# Patient Record
Sex: Male | Born: 1955 | Race: White | Hispanic: No | Smoking: Former smoker
Health system: Southern US, Community
[De-identification: ages and names within clinical notes are randomized; demographics above are authoritative.]

## PROBLEM LIST (undated history)

## (undated) DIAGNOSIS — N200 Calculus of kidney: Secondary | ICD-10-CM

## (undated) DIAGNOSIS — E785 Hyperlipidemia, unspecified: Secondary | ICD-10-CM

## (undated) DIAGNOSIS — I10 Essential (primary) hypertension: Secondary | ICD-10-CM

## (undated) DIAGNOSIS — I2510 Atherosclerotic heart disease of native coronary artery without angina pectoris: Secondary | ICD-10-CM

## (undated) HISTORY — DX: Essential (primary) hypertension: I10

## (undated) HISTORY — PX: APPENDECTOMY: SHX54

## (undated) HISTORY — DX: Atherosclerotic heart disease of native coronary artery without angina pectoris: I25.10

## (undated) HISTORY — DX: Calculus of kidney: N20.0

## (undated) HISTORY — DX: Hyperlipidemia, unspecified: E78.5

---

## 2005-07-11 ENCOUNTER — Ambulatory Visit: Payer: Self-pay | Admitting: Internal Medicine

## 2005-07-11 ENCOUNTER — Encounter: Admission: RE | Admit: 2005-07-11 | Discharge: 2005-07-11 | Payer: Self-pay | Admitting: Internal Medicine

## 2006-01-07 ENCOUNTER — Ambulatory Visit: Payer: Self-pay | Admitting: Internal Medicine

## 2011-12-04 DIAGNOSIS — Z0000 Encounter for general adult medical examination without abnormal findings: Secondary | ICD-10-CM | POA: Insufficient documentation

## 2011-12-04 HISTORY — DX: Encounter for general adult medical examination without abnormal findings: Z00.00

## 2015-08-17 ENCOUNTER — Encounter (HOSPITAL_COMMUNITY): Payer: Self-pay | Admitting: Emergency Medicine

## 2015-08-17 ENCOUNTER — Emergency Department (HOSPITAL_COMMUNITY): Payer: No Typology Code available for payment source

## 2015-08-17 ENCOUNTER — Emergency Department (HOSPITAL_COMMUNITY)
Admission: EM | Admit: 2015-08-17 | Discharge: 2015-08-17 | Disposition: A | Discharge status: Home / Self Care | Payer: No Typology Code available for payment source | Attending: Emergency Medicine | Admitting: Emergency Medicine

## 2015-08-17 DIAGNOSIS — N23 Unspecified renal colic: Secondary | ICD-10-CM

## 2015-08-17 DIAGNOSIS — R109 Unspecified abdominal pain: Secondary | ICD-10-CM | POA: Diagnosis present

## 2015-08-17 LAB — URINALYSIS, ROUTINE W REFLEX MICROSCOPIC
Bilirubin Urine: NEGATIVE
Glucose, UA: NEGATIVE mg/dL
Ketones, ur: NEGATIVE mg/dL
Leukocytes, UA: NEGATIVE
Nitrite: NEGATIVE
Protein, ur: NEGATIVE mg/dL
Specific Gravity, Urine: 1.025 (ref 1.005–1.030)
pH: 6 (ref 5.0–8.0)

## 2015-08-17 LAB — BASIC METABOLIC PANEL
Anion gap: 11 (ref 5–15)
BUN: 18 mg/dL (ref 6–20)
CO2: 23 mmol/L (ref 22–32)
Calcium: 9.2 mg/dL (ref 8.9–10.3)
Chloride: 107 mmol/L (ref 101–111)
Creatinine, Ser: 1.27 mg/dL — ABNORMAL HIGH (ref 0.61–1.24)
GFR calc Af Amer: >60 mL/min (ref >60)
GFR calc non Af Amer: 60 mL/min — ABNORMAL LOW (ref >60)
Glucose, Bld: 167 mg/dL — ABNORMAL HIGH (ref 65–99)
Potassium: 4.6 mmol/L (ref 3.5–5.1)
Sodium: 141 mmol/L (ref 135–145)

## 2015-08-17 LAB — CBC
HCT: 47.8 % (ref 39.0–52.0)
Hemoglobin: 16.7 g/dL (ref 13.0–17.0)
MCH: 31.9 pg (ref 26.0–34.0)
MCHC: 34.9 g/dL (ref 30.0–36.0)
MCV: 91.4 fL (ref 78.0–100.0)
Platelets: 190 10*3/uL (ref 150–400)
RBC: 5.23 MIL/uL (ref 4.22–5.81)
RDW: 12.9 % (ref 11.5–15.5)
WBC: 15.9 10*3/uL — ABNORMAL HIGH (ref 4.0–10.5)

## 2015-08-17 LAB — URINE MICROSCOPIC-ADD ON: Squamous Epithelial / LPF: NONE SEEN

## 2015-08-17 MED ORDER — OXYCODONE-ACETAMINOPHEN 5-325 MG PO TABS: 1.0000 | ORAL_TABLET | ORAL | Status: DC | PRN

## 2015-08-17 MED ORDER — KETOROLAC TROMETHAMINE 15 MG/ML IJ SOLN
15.0000 mg | Freq: Once | INTRAMUSCULAR | Status: AC
  Administered 2015-08-17: 15 mg via INTRAVENOUS
  Filled 2015-08-17: qty 1

## 2015-08-17 MED ORDER — SODIUM CHLORIDE 0.9 % IV BOLUS (SEPSIS)
1000.0000 mL | Freq: Once | INTRAVENOUS | Status: AC
  Administered 2015-08-17: 1000 mL via INTRAVENOUS

## 2015-08-17 MED ORDER — HYDROMORPHONE HCL 1 MG/ML IJ SOLN
1.0000 mg | Freq: Once | INTRAMUSCULAR | Status: AC
  Administered 2015-08-17: 1 mg via INTRAVENOUS
  Filled 2015-08-17: qty 1

## 2015-08-17 NOTE — ED Notes (Signed)
Pt c/o sudden onset R flank pain since 3:30pm this afternoon. Pt denies hx of kidney stone. Pt was seen at urgent care earlier today and saw blood in his urine. Urgent care sent him here for CT scan. Pt denies burning upon urination but c/o urinary retention. Pt was able to go when he got here and has specimen available at this time. Pt unable to sit comfortably due to pain. A&Ox4.

## 2015-08-17 NOTE — Discharge Instructions (Signed)
Kidney Stones °Kidney stones (urolithiasis) are deposits that form inside your kidneys. The intense pain is caused by the stone moving through the urinary tract. When the stone moves, the ureter goes into spasm around the stone. The stone is usually passed in the urine.  °CAUSES  °· A disorder that makes certain neck glands produce too much parathyroid hormone (primary hyperparathyroidism). °· A buildup of uric acid crystals, similar to gout in your joints. °· Narrowing (stricture) of the ureter. °· A kidney obstruction present at birth (congenital obstruction). °· Previous surgery on the kidney or ureters. °· Numerous kidney infections. °SYMPTOMS  °· Feeling sick to your stomach (nauseous). °· Throwing up (vomiting). °· Blood in the urine (hematuria). °· Pain that usually spreads (radiates) to the groin. °· Frequency or urgency of urination. °DIAGNOSIS  °· Taking a history and physical exam. °· Blood or urine tests. °· CT scan. °· Occasionally, an examination of the inside of the urinary bladder (cystoscopy) is performed. °TREATMENT  °· Observation. °· Increasing your fluid intake. °· Extracorporeal shock wave lithotripsy--This is a noninvasive procedure that uses shock waves to break up kidney stones. °· Surgery may be needed if you have severe pain or persistent obstruction. There are various surgical procedures. Most of the procedures are performed with the use of small instruments. Only small incisions are needed to accommodate these instruments, so recovery time is minimized. °The size, location, and chemical composition are all important variables that will determine the proper choice of action for you. Talk to your health care provider to better understand your situation so that you will minimize the risk of injury to yourself and your kidney.  °HOME CARE INSTRUCTIONS  °· Drink enough water and fluids to keep your urine clear or pale yellow. This will help you to pass the stone or stone fragments. °· Strain  all urine through the provided strainer. Keep all particulate matter and stones for your health care provider to see. The stone causing the pain may be as small as a grain of salt. It is very important to use the strainer each and every time you pass your urine. The collection of your stone will allow your health care provider to analyze it and verify that a stone has actually passed. The stone analysis will often identify what you can do to reduce the incidence of recurrences. °· Only take over-the-counter or prescription medicines for pain, discomfort, or fever as directed by your health care provider. °· Keep all follow-up visits as told by your health care provider. This is important. °· Get follow-up X-rays if required. The absence of pain does not always mean that the stone has passed. It may have only stopped moving. If the urine remains completely obstructed, it can cause loss of kidney function or even complete destruction of the kidney. It is your responsibility to make sure X-rays and follow-ups are completed. Ultrasounds of the kidney can show blockages and the status of the kidney. Ultrasounds are not associated with any radiation and can be performed easily in a matter of minutes. °· Make changes to your daily diet as told by your health care provider. You may be told to: °¨ Limit the amount of salt that you eat. °¨ Eat 5 or more servings of fruits and vegetables each day. °¨ Limit the amount of meat, poultry, fish, and eggs that you eat. °· Collect a 24-hour urine sample as told by your health care provider. You may need to collect another urine sample every 6-12   months. °SEEK MEDICAL CARE IF: °· You experience pain that is progressive and unresponsive to any pain medicine you have been prescribed. °SEEK IMMEDIATE MEDICAL CARE IF:  °· Pain cannot be controlled with the prescribed medicine. °· You have a fever or shaking chills. °· The severity or intensity of pain increases over 18 hours and is not  relieved by pain medicine. °· You develop a new onset of abdominal pain. °· You feel faint or pass out. °· You are unable to urinate. °  °This information is not intended to replace advice given to you by your health care provider. Make sure you discuss any questions you have with your health care provider. °  °Document Released: 07/07/2005 Document Revised: 03/28/2015 Document Reviewed: 12/08/2012 °Elsevier Interactive Patient Education ©2016 [employer] ° °

## 2015-08-22 NOTE — ED Provider Notes (Signed)
CSN: [id number]     Arrival date & time 08/17/15  1933 History   First MD Initiated Contact with Patient 08/17/15 2015     Chief Complaint  Patient presents with  . Flank Pain     (Consider location/radiation/quality/duration/timing/severity/associated sxs/prior Treatment) HPI   60 year old male with right flank pain. Acute onset around 3:30 this afternoon.Pain feels like a deep ache with occasional sharper pain. He cannot find a comfortable position. He went to urgent care prior to arrival and reports that they told him he had blood in his urine. Denies any past history of kidney stones are similar symptoms. No specific urinary complaints. Nausea and did vomit once earlier today. No fevers or chills.  History reviewed. No pertinent past medical history. Past Surgical History  Procedure Laterality Date  . Appendectomy     No family history on file. Social History  Substance Use Topics  . Smoking status: Never Smoker   . Smokeless tobacco: None  . Alcohol Use: Yes    Review of Systems  All systems reviewed and negative, other than as noted in HPI.   Allergies  Review of patient's allergies indicates no known allergies.  Home Medications   Prior to Admission medications   Medication Sig Start Date End Date Taking? Authorizing Provider  loratadine-pseudoephedrine (CLARITIN-D 24-HOUR) 10-240 MG 24 hr tablet Take 1 tablet by mouth daily as needed for allergies.   Yes Historical Provider, MD  oxyCODONE-acetaminophen (PERCOCET/ROXICET) 5-325 MG tablet Take 1-2 tablets by mouth every 4 (four) hours as needed for severe pain. 08/17/15   Raeford Razor, MD   BP 155/84 mmHg  Pulse 76  Temp(Src) 98.2 F (36.8 C) (Oral)  Resp 16  SpO2 97% Physical Exam  Constitutional: He appears well-developed and well-nourished. No distress.  laying in bed. Appears uncomfortable, but not toxic.  HENT:  Head: Normocephalic and atraumatic.  Eyes: Conjunctivae are normal. Right eye exhibits no  discharge. Left eye exhibits no discharge.  Neck: Neck supple.  Cardiovascular: Normal rate, regular rhythm and normal heart sounds.  Exam reveals no gallop and no friction rub.   No murmur heard. Pulmonary/Chest: Effort normal and breath sounds normal. No respiratory distress.  Abdominal: Soft. He exhibits no distension. There is no tenderness.  Musculoskeletal: He exhibits no edema or tenderness.  Neurological: He is alert.  Skin: Skin is warm and dry.  Psychiatric: He has a normal mood and affect. His behavior is normal. Thought content normal.  Nursing note and vitals reviewed.   ED Course  Procedures (including critical care time) Labs Review Labs Reviewed  CBC - Abnormal; Notable for the following:    WBC 15.9 (*)    All other components within normal limits  URINALYSIS, ROUTINE W REFLEX MICROSCOPIC (NOT AT [facility]) - Abnormal; Notable for the following:    APPearance CLOUDY (*)    Hgb urine dipstick TRACE (*)    All other components within normal limits  BASIC METABOLIC PANEL - Abnormal; Notable for the following:    Glucose, Bld 167 (*)    Creatinine, Ser 1.27 (*)    GFR calc non Af Amer 60 (*)    All other components within normal limits  URINE MICROSCOPIC-ADD ON - Abnormal; Notable for the following:    Bacteria, UA RARE (*)    Crystals CA OXALATE CRYSTALS (*)    All other components within normal limits    Imaging Review No results found. I have personally reviewed and evaluated these images and lab results as part  of my medical decision-making.   EKG Interpretation None      MDM   Final diagnoses:  Ureteral colic    Right flank pain. Distal right ureteral stone.Symptoms controlled. Afebrile. renal function is fine. UA is not consistent with infection. I feel appropriate for discharge at this time.  Expectant management. Urology follow-up as needed. Return precautions discussed.    Raeford Razor, MD 08/22/15 1218

## 2019-06-21 ENCOUNTER — Other Ambulatory Visit: Payer: Self-pay

## 2019-06-21 ENCOUNTER — Emergency Department (HOSPITAL_COMMUNITY): Payer: BC Managed Care – PPO

## 2019-06-21 ENCOUNTER — Inpatient Hospital Stay (HOSPITAL_COMMUNITY)
Admission: EM | Admit: 2019-06-21 | Discharge: 2019-06-28 | DRG: 177 | Disposition: A | Discharge status: Home / Self Care | Payer: BC Managed Care – PPO | Attending: Family Medicine | Admitting: Family Medicine

## 2019-06-21 ENCOUNTER — Encounter (HOSPITAL_COMMUNITY): Payer: Self-pay

## 2019-06-21 DIAGNOSIS — R0602 Shortness of breath: Secondary | ICD-10-CM

## 2019-06-21 DIAGNOSIS — J1289 Other viral pneumonia: Secondary | ICD-10-CM

## 2019-06-21 DIAGNOSIS — Z79899 Other long term (current) drug therapy: Secondary | ICD-10-CM

## 2019-06-21 DIAGNOSIS — J159 Unspecified bacterial pneumonia: Secondary | ICD-10-CM | POA: Diagnosis present

## 2019-06-21 DIAGNOSIS — E871 Hypo-osmolality and hyponatremia: Secondary | ICD-10-CM | POA: Diagnosis present

## 2019-06-21 DIAGNOSIS — U071 COVID-19: Secondary | ICD-10-CM | POA: Diagnosis present

## 2019-06-21 DIAGNOSIS — R0902 Hypoxemia: Secondary | ICD-10-CM

## 2019-06-21 DIAGNOSIS — R739 Hyperglycemia, unspecified: Secondary | ICD-10-CM | POA: Diagnosis present

## 2019-06-21 DIAGNOSIS — J9601 Acute respiratory failure with hypoxia: Secondary | ICD-10-CM | POA: Diagnosis present

## 2019-06-21 DIAGNOSIS — J1282 Pneumonia due to coronavirus disease 2019: Secondary | ICD-10-CM | POA: Diagnosis present

## 2019-06-21 LAB — TRIGLYCERIDES: Triglycerides: 145 mg/dL (ref <150)

## 2019-06-21 LAB — CBC WITH DIFFERENTIAL/PLATELET
Abs Immature Granulocytes: 0.19 10*3/uL — ABNORMAL HIGH (ref 0.00–0.07)
Basophils Absolute: 0.1 10*3/uL (ref 0.0–0.1)
Basophils Relative: 1 %
Eosinophils Absolute: 0 10*3/uL (ref 0.0–0.5)
Eosinophils Relative: 0 %
HCT: 48.4 % (ref 39.0–52.0)
Hemoglobin: 16.2 g/dL (ref 13.0–17.0)
Immature Granulocytes: 1 %
Lymphocytes Relative: 9 %
Lymphs Abs: 1.2 10*3/uL (ref 0.7–4.0)
MCH: 30.4 pg (ref 26.0–34.0)
MCHC: 33.5 g/dL (ref 30.0–36.0)
MCV: 90.8 fL (ref 80.0–100.0)
Monocytes Absolute: 0.9 10*3/uL (ref 0.1–1.0)
Monocytes Relative: 6 %
Neutro Abs: 11.1 10*3/uL — ABNORMAL HIGH (ref 1.7–7.7)
Neutrophils Relative %: 83 %
Platelets: 277 10*3/uL (ref 150–400)
RBC: 5.33 MIL/uL (ref 4.22–5.81)
RDW: 12.5 % (ref 11.5–15.5)
WBC: 13.5 10*3/uL — ABNORMAL HIGH (ref 4.0–10.5)
nRBC: 0 % (ref 0.0–0.2)

## 2019-06-21 LAB — COMPREHENSIVE METABOLIC PANEL
ALT: 55 U/L — ABNORMAL HIGH (ref 0–44)
AST: 48 U/L — ABNORMAL HIGH (ref 15–41)
Albumin: 3.1 g/dL — ABNORMAL LOW (ref 3.5–5.0)
Alkaline Phosphatase: 59 U/L (ref 38–126)
Anion gap: 13 (ref 5–15)
BUN: 17 mg/dL (ref 8–23)
CO2: 24 mmol/L (ref 22–32)
Calcium: 9.2 mg/dL (ref 8.9–10.3)
Chloride: 97 mmol/L — ABNORMAL LOW (ref 98–111)
Creatinine, Ser: 0.92 mg/dL (ref 0.61–1.24)
GFR calc Af Amer: >60 mL/min (ref >60)
GFR calc non Af Amer: >60 mL/min (ref >60)
Glucose, Bld: 131 mg/dL — ABNORMAL HIGH (ref 70–99)
Potassium: 3.6 mmol/L (ref 3.5–5.1)
Sodium: 134 mmol/L — ABNORMAL LOW (ref 135–145)
Total Bilirubin: 1 mg/dL (ref 0.3–1.2)
Total Protein: 7.4 g/dL (ref 6.5–8.1)

## 2019-06-21 LAB — D-DIMER, QUANTITATIVE: D-Dimer, Quant: 2.38 ug/mL-FEU — ABNORMAL HIGH (ref 0.00–0.50)

## 2019-06-21 LAB — PROCALCITONIN: Procalcitonin: 0.32 ng/mL

## 2019-06-21 LAB — FERRITIN: Ferritin: 1867 ng/mL — ABNORMAL HIGH (ref 24–336)

## 2019-06-21 LAB — LACTIC ACID, PLASMA: Lactic Acid, Venous: 1.8 mmol/L (ref 0.5–1.9)

## 2019-06-21 LAB — LACTATE DEHYDROGENASE: LDH: 417 U/L — ABNORMAL HIGH (ref 98–192)

## 2019-06-21 LAB — C-REACTIVE PROTEIN: CRP: 17.6 mg/dL — ABNORMAL HIGH (ref <1.0)

## 2019-06-21 LAB — FIBRINOGEN: Fibrinogen: >800 mg/dL — ABNORMAL HIGH (ref 210–475)

## 2019-06-21 MED ORDER — SODIUM CHLORIDE 0.9 % IV SOLN
500.0000 mg | INTRAVENOUS | Status: AC
  Administered 2019-06-21 – 2019-06-25 (×5): 500 mg via INTRAVENOUS
  Filled 2019-06-21 (×5): qty 500

## 2019-06-21 MED ORDER — ONDANSETRON HCL 4 MG/2ML IJ SOLN: 4.0000 mg | Freq: Four times a day (QID) | INTRAMUSCULAR | Status: DC | PRN

## 2019-06-21 MED ORDER — SODIUM CHLORIDE 0.9 % IV SOLN
2.0000 g | INTRAVENOUS | Status: AC
  Administered 2019-06-21 – 2019-06-25 (×5): 2 g via INTRAVENOUS
  Filled 2019-06-21 (×6): qty 20

## 2019-06-21 MED ORDER — SODIUM CHLORIDE 0.9 % IV SOLN
100.0000 mg | INTRAVENOUS | Status: DC
  Administered 2019-06-22 – 2019-06-24 (×3): 100 mg via INTRAVENOUS
  Filled 2019-06-21 (×3): qty 100

## 2019-06-21 MED ORDER — DEXAMETHASONE SODIUM PHOSPHATE 10 MG/ML IJ SOLN: 6.0000 mg | INTRAMUSCULAR | Status: DC

## 2019-06-21 MED ORDER — ONDANSETRON HCL 4 MG PO TABS: 4.0000 mg | ORAL_TABLET | Freq: Four times a day (QID) | ORAL | Status: DC | PRN

## 2019-06-21 MED ORDER — FAMOTIDINE 20 MG PO TABS
20.0000 mg | ORAL_TABLET | Freq: Two times a day (BID) | ORAL | Status: DC
  Administered 2019-06-21 – 2019-06-28 (×14): 20 mg via ORAL
  Filled 2019-06-21 (×15): qty 1

## 2019-06-21 MED ORDER — HYDROCODONE-ACETAMINOPHEN 5-325 MG PO TABS: 1.0000 | ORAL_TABLET | ORAL | Status: DC | PRN

## 2019-06-21 MED ORDER — ENOXAPARIN SODIUM 40 MG/0.4ML ~~LOC~~ SOLN
40.0000 mg | SUBCUTANEOUS | Status: DC
  Administered 2019-06-21: 40 mg via SUBCUTANEOUS
  Filled 2019-06-21: qty 0.4

## 2019-06-21 MED ORDER — ACETAMINOPHEN 325 MG PO TABS: 650.0000 mg | ORAL_TABLET | Freq: Four times a day (QID) | ORAL | Status: DC | PRN

## 2019-06-21 MED ORDER — DEXAMETHASONE SODIUM PHOSPHATE 10 MG/ML IJ SOLN
6.0000 mg | Freq: Once | INTRAMUSCULAR | Status: AC
  Administered 2019-06-21: 6 mg via INTRAVENOUS
  Filled 2019-06-21: qty 1

## 2019-06-21 MED ORDER — SODIUM CHLORIDE 0.9 % IV SOLN
200.0000 mg | Freq: Once | INTRAVENOUS | Status: AC
  Administered 2019-06-21: 200 mg via INTRAVENOUS
  Filled 2019-06-21: qty 40

## 2019-06-21 MED ORDER — GUAIFENESIN-DM 100-10 MG/5ML PO SYRP: 10.0000 mL | ORAL_SOLUTION | ORAL | Status: DC | PRN

## 2019-06-21 MED ORDER — HYDROCOD POLST-CPM POLST ER 10-8 MG/5ML PO SUER: 5.0000 mL | Freq: Two times a day (BID) | ORAL | Status: DC | PRN

## 2019-06-21 NOTE — ED Triage Notes (Signed)
Pt reports tested positive on 11/25 for COVID. Pt states he has been able to keep fevers down, but his SpO2 dropped to 76% while sitting today with home monitor. Pt ambulated back to room per pt request and became very SHOB.

## 2019-06-21 NOTE — ED Notes (Signed)
[facility] dispatch notified for need of transport.  

## 2019-06-21 NOTE — ED Notes (Signed)
Report attempted and was told to call back in 10 minutes so that room can be assigned to a nurse.

## 2019-06-21 NOTE — H&P (Signed)
History and Physical    OMER PUCCINELLI WNI:[id number] DOB: 1956/05/24 DOA: 06/21/2019  PCP: Patient, No Pcp Per   Patient coming from: Home   Chief Complaint: SOB, low O2 saturation   HPI: RED MANDT is a 63 y.o. male who denies any significant past medical history now presents emergency department for evaluation of progressive shortness of breath and low oxygen saturations at home.  Patient reports that he developed upper respiratory symptoms, fevers, headache, and general malaise little over a week ago, was found to be positive for COVID-19 (outpatient test from 06/13/2019 is posted in media tab in electronic medical record), started an antibiotic and steroid, has been taking acetaminophen at home, reports resolution of fevers and headache over the past couple days, but has had progressive dyspnea.  Patient has home pulse oximeter and noted that his saturation was dipping into the mid to upper 70s with minimal exertion today.  He has had a cough that is productive of thick dark sputum, denies chest pain or leg swelling, denies leg tenderness, and denies any abdominal pain.  ED Course: Upon arrival to the ED, patient is found to be afebrile, saturating 78% on room air, tachypneic to 30, and with stable blood pressure.  EKG features a sinus rhythm and chest x-ray is concerning for focal infiltrate in the left lower lobe.  Chemistry panel is notable for slight hyponatremia and mild elevation in transaminases.  CBC features a leukocytosis to 13,500.  Lactic acid is normal.  D-dimer is elevated to 2.38.  Blood cultures were collected in the emergency department and the patient was treated with Decadron and supplemental oxygen.  Review of Systems:  All other systems reviewed and apart from HPI, are negative.  History reviewed. No pertinent past medical history.  Past Surgical History:  Procedure Laterality Date  . APPENDECTOMY       reports that he has never smoked. He does not have any  smokeless tobacco history on file. He reports current alcohol use. No history on file for drug.  No Known Allergies  History reviewed. No pertinent family history.   Prior to Admission medications   Medication Sig Start Date End Date Taking? Authorizing Provider  loratadine-pseudoephedrine (CLARITIN-D 24-HOUR) 10-240 MG 24 hr tablet Take 1 tablet by mouth daily as needed for allergies.    [provider]  oxyCODONE-acetaminophen (PERCOCET/ROXICET) 5-325 MG tablet Take 1-2 tablets by mouth every 4 (four) hours as needed for severe pain. 08/17/15   Virgel Manifold, MD    Physical Exam: Vitals:   06/21/19 1700 06/21/19 1800 06/21/19 1830 06/21/19 1900  BP: (!) 158/88 (!) 159/84 (!) 149/82 (!) 158/84  Pulse: 92 91 94 92  Resp: (!) 30 (!) 23 (!) 26 (!) 25  Temp:      TempSrc:      SpO2: 94% 93% 94% 95%  Weight:      Height:        Constitutional: NAD, calm  Eyes: PERTLA, lids and conjunctivae normal ENMT: Mucous membranes are moist. Posterior pharynx clear of any exudate or lesions.   Neck: normal, supple, no masses, no thyromegaly Respiratory: Tachypnea. No pallor or cyanosis. Symmetric chest wall expansion.  Cardiovascular: S1 & S2 heard, regular rate and rhythm. No extremity edema.   Abdomen: No distension, no tenderness, soft. Bowel sounds acive.  Musculoskeletal: no clubbing / cyanosis. No joint deformity upper and lower extremities.  Skin: no significant rashes, lesions, ulcers. Warm, dry, well-perfused. Neurologic: No facial asymmetry. Sensation intact. Moving all  extremities.  Psychiatric: Alert and oriented to person, place, and situation. Pleasant, cooperative.     Labs on Admission: I have personally reviewed following labs and imaging studies  CBC: Recent Labs  Lab 06/21/19 1721  WBC 13.5*  NEUTROABS 11.1*  HGB 16.2  HCT 48.4  MCV 90.8  PLT 277   Basic Metabolic Panel: Recent Labs  Lab 06/21/19 1721  NA 134*  K 3.6  CL 97*  CO2 24  GLUCOSE  131*  BUN 17  CREATININE 0.92  CALCIUM 9.2   GFR: Estimated Creatinine Clearance: 102 mL/min (by C-G formula based on SCr of 0.92 mg/dL). Liver Function Tests: Recent Labs  Lab 06/21/19 1721  AST 48*  ALT 55*  ALKPHOS 59  BILITOT 1.0  PROT 7.4  ALBUMIN 3.1*   No results for input(s): LIPASE, AMYLASE in the last 168 hours. No results for input(s): AMMONIA in the last 168 hours. Coagulation Profile: No results for input(s): INR, PROTIME in the last 168 hours. Cardiac Enzymes: No results for input(s): CKTOTAL, CKMB, CKMBINDEX, TROPONINI in the last 168 hours. BNP (last 3 results) No results for input(s): PROBNP in the last 8760 hours. HbA1C: No results for input(s): HGBA1C in the last 72 hours. CBG: No results for input(s): GLUCAP in the last 168 hours. Lipid Profile: Recent Labs    06/21/19 1721  TRIG 145   Thyroid Function Tests: No results for input(s): TSH, T4TOTAL, FREET4, T3FREE, THYROIDAB in the last 72 hours. Anemia Panel: Recent Labs    06/21/19 1721  FERRITIN 1,867*   Urine analysis:    Component Value Date/Time   COLORURINE YELLOW 08/17/2015 2004   APPEARANCEUR CLOUDY (A) 08/17/2015 2004   LABSPEC 1.025 08/17/2015 2004   PHURINE 6.0 08/17/2015 2004   GLUCOSEU NEGATIVE 08/17/2015 2004   HGBUR TRACE (A) 08/17/2015 2004   BILIRUBINUR NEGATIVE 08/17/2015 2004   KETONESUR NEGATIVE 08/17/2015 2004   PROTEINUR NEGATIVE 08/17/2015 2004   NITRITE NEGATIVE 08/17/2015 2004   LEUKOCYTESUR NEGATIVE 08/17/2015 2004   Sepsis Labs: @LABRCNTIP (procalcitonin:4,lacticidven:4) )No results found for this or any previous visit (from the past 240 hour(s)).   Radiological Exams on Admission: Dg Chest Port 1 View  Result Date: 06/21/2019 CLINICAL DATA:  COVID-19. Hypoxia. EXAM: PORTABLE CHEST 1 VIEW COMPARISON:  None. FINDINGS: The heart size and pulmonary vascularity are normal. There is a hazy infiltrate at the left lung base seen through the left side of the heart.  Slight peripheral haziness at the lung bases is most likely a overlying soft tissue. IMPRESSION: Focal infiltrate in the left lower lobe posterior medially. Electronically Signed   By: 14/07/2018 M.D.   On: 06/21/2019 18:53    EKG: Independently reviewed. Sinus rhythm, rate 89, QTc 453 ms.   Assessment/Plan   1. Pneumonia; COVID-19; acute hypoxic respiratory failure  - Presents with progressive SOB and low O2 saturations at home after testing positive for COVID-19 on 11/23 (outpatient test; scanned to media tab in EMR) - There is leukocytosis, productive cough, and focal infiltrate on CXR concerning for secondary bacterial pneumonia  - Blood cultures collected in ED, will add sputum cultures and check strep pneumo and legionella antigens, trend procalcitonin, start Rocephin and azithromycin, continue Decadron, start remdesivir, continue isolation, trend inflammatory markers    2. Elevated d-dimer  - D-dimer is elevated to 2.38 in ED  - There is no chest pain or hemoptysis, and no evidence for DVT, and this is likely secondary to the acute infectious illness  - Continue to  treat pneumonia and trend    DVT prophylaxis: Lovenox  Code Status: Full  Family Communication: Discussed with patient  Consults called: None  Admission status: Inpatient    Briscoe Deutscherimothy S Rasheda Ledger, MD [facility] Pager [phone]  If 7PM-7AM, please contact night-coverage [email or web address] Password [email or web address]  06/21/2019, 7:36 PM

## 2019-06-21 NOTE — ED Notes (Signed)
ED TO INPATIENT HANDOFF REPORT  Name/Age/Gender Nicholas Marks 63 y.o. male  Code Status    Code Status Orders  (From admission, onward)         Start     Ordered   06/21/19 2047  Full code  Continuous     06/21/19 2046        Code Status History    This patient has a current code status but no historical code status.   Advance Care Planning Activity      Home/SNF/Other Home  Chief Complaint SOB  Level of Care/Admitting Diagnosis ED Disposition    ED Disposition Condition Comment   Admit  [facility] [[id number]]  Level of Care: Med-Surg [16]  Covid Evaluation: Confirmed COVID Positive  Diagnosis: Pneumonia due to COVID-19 virus [[id number]][[phone]]  Admitting Physician: Briscoe DeutscherPYD, TIMOTHY S [[id number]]  Attending Physician: Briscoe DeutscherOPYD, TIMOTHY S [[id number]]  Estimated length of stay: past midnight tomorrow  Certification:: I certify this patient will need inpatient services for at least 2 midnights  PT Class (Do Not Modify): Inpatient [101]  PT Acc Code (Do Not Modify): Private [1]       Medical History History reviewed. No pertinent past medical history.  Allergies No Known Allergies  IV Location/Drains/Wounds Patient Lines/Drains/Airways Status   Active Line/Drains/Airways    Name:   Placement date:   Placement time:   Site:   Days:   Peripheral IV 06/21/19 Right;Upper Forearm   06/21/19    1732    Forearm   less than 1          Labs/Imaging Results for orders placed or performed during the hospital encounter of 06/21/19 (from the past 48 hour(s))  Lactic acid, plasma     Status: None   Collection Time: 06/21/19  5:21 PM  Result Value Ref Range   Lactic Acid, Venous 1.8 0.5 - 1.9 mmol/L    Comment: Performed at [facility], [address] [place], [place] [address]  CBC WITH DIFFERENTIAL     Status: Abnormal   Collection Time: 06/21/19  5:21 PM  Result Value Ref Range   WBC 13.5 (H) 4.0 - 10.5 K/uL   RBC 5.33  4.22 - 5.81 MIL/uL   Hemoglobin 16.2 13.0 - 17.0 g/dL   HCT 65.748.4 84.639.0 - 96.252.0 %   MCV 90.8 80.0 - 100.0 fL   MCH 30.4 26.0 - 34.0 pg   MCHC 33.5 30.0 - 36.0 g/dL   RDW 95.212.5 84.111.5 - 32.415.5 %   Platelets 277 150 - 400 K/uL   nRBC 0.0 0.0 - 0.2 %   Neutrophils Relative % 83 %   Neutro Abs 11.1 (H) 1.7 - 7.7 K/uL   Lymphocytes Relative 9 %   Lymphs Abs 1.2 0.7 - 4.0 K/uL   Monocytes Relative 6 %   Monocytes Absolute 0.9 0.1 - 1.0 K/uL   Eosinophils Relative 0 %   Eosinophils Absolute 0.0 0.0 - 0.5 K/uL   Basophils Relative 1 %   Basophils Absolute 0.1 0.0 - 0.1 K/uL   Immature Granulocytes 1 %   Abs Immature Granulocytes 0.19 (H) 0.00 - 0.07 K/uL    Comment: Performed at [facility], [address] [place], [place] [address]  Comprehensive metabolic panel     Status: Abnormal   Collection Time: 06/21/19  5:21 PM  Result Value Ref Range   Sodium 134 (L) 135 - 145 mmol/L   Potassium 3.6 3.5 - 5.1  mmol/L   Chloride 97 (L) 98 - 111 mmol/L   CO2 24 22 - 32 mmol/L   Glucose, Bld 131 (H) 70 - 99 mg/dL   BUN 17 8 - 23 mg/dL   Creatinine, Ser 5.62 0.61 - 1.24 mg/dL   Calcium 9.2 8.9 - 13.0 mg/dL   Total Protein 7.4 6.5 - 8.1 g/dL   Albumin 3.1 (L) 3.5 - 5.0 g/dL   AST 48 (H) 15 - 41 U/L   ALT 55 (H) 0 - 44 U/L   Alkaline Phosphatase 59 38 - 126 U/L   Total Bilirubin 1.0 0.3 - 1.2 mg/dL   GFR calc non Af Amer >60 >60 mL/min   GFR calc Af Amer >60 >60 mL/min   Anion gap 13 5 - 15    Comment: Performed at [facility], [address] [place], [place] [address]  D-dimer, quantitative     Status: Abnormal   Collection Time: 06/21/19  5:21 PM  Result Value Ref Range   D-Dimer, Quant 2.38 (H) 0.00 - 0.50 ug/mL-FEU    Comment: (NOTE) At the manufacturer cut-off of 0.50 ug/mL FEU, this assay has been documented to exclude PE with a sensitivity and negative predictive value of 97 to 99%.  At this time, this assay has not been approved by the FDA to  exclude DVT/VTE. Results should be correlated with clinical presentation. Performed at [facility], [address] [place], [place] [address]   Procalcitonin     Status: None   Collection Time: 06/21/19  5:21 PM  Result Value Ref Range   Procalcitonin 0.32 ng/mL    Comment:        Interpretation: PCT (Procalcitonin) <= 0.5 ng/mL: Systemic infection (sepsis) is not likely. Local bacterial infection is possible. (NOTE)       Sepsis PCT Algorithm           Lower Respiratory Tract                                      Infection PCT Algorithm    ----------------------------     ----------------------------         PCT < 0.25 ng/mL                PCT < 0.10 ng/mL         Strongly encourage             Strongly discourage   discontinuation of antibiotics    initiation of antibiotics    ----------------------------     -----------------------------       PCT 0.25 - 0.50 ng/mL            PCT 0.10 - 0.25 ng/mL               OR       >80% decrease in PCT            Discourage initiation of                                            antibiotics      Encourage discontinuation           of antibiotics    ----------------------------     -----------------------------  PCT >= 0.50 ng/mL              PCT 0.26 - 0.50 ng/mL               AND        <80% decrease in PCT             Encourage initiation of                                             antibiotics       Encourage continuation           of antibiotics    ----------------------------     -----------------------------        PCT >= 0.50 ng/mL                  PCT > 0.50 ng/mL               AND         increase in PCT                  Strongly encourage                                      initiation of antibiotics    Strongly encourage escalation           of antibiotics                                     -----------------------------                                           PCT <= 0.25 ng/mL                                                  OR                                        > 80% decrease in PCT                                     Discontinue / Do not initiate                                             antibiotics Performed at [facility], [address], [place], [place] [address]   Lactate dehydrogenase     Status: Abnormal   Collection Time: 06/21/19  5:21 PM  Result Value Ref Range   LDH 417 (H) 98 - 192 U/L    Comment: Performed at [facility], [address], [place], [place] [address]  Ferritin  Status: Abnormal   Collection Time: 06/21/19  5:21 PM  Result Value Ref Range   Ferritin 1,867 (H) 24 - 336 ng/mL    Comment: Performed at [facility], [address] [place], [place] [address]  Triglycerides     Status: None   Collection Time: 06/21/19  5:21 PM  Result Value Ref Range   Triglycerides 145 <150 mg/dL    Comment: Performed at [facility], [address], [place], [place] [address]  Fibrinogen     Status: Abnormal   Collection Time: 06/21/19  5:21 PM  Result Value Ref Range   Fibrinogen >800 (H) 210 - 475 mg/dL    Comment: Performed at [facility], [address], [place], [place] [address]  C-reactive protein     Status: Abnormal   Collection Time: 06/21/19  5:21 PM  Result Value Ref Range   CRP 17.6 (H) <1.0 mg/dL    Comment: Performed at [facility], [address] [place], [place] [address]   Dg Chest Port 1 View  Result Date: 06/21/2019 CLINICAL DATA:  COVID-19. Hypoxia. EXAM: PORTABLE CHEST 1 VIEW COMPARISON:  None. FINDINGS: The heart size and pulmonary vascularity are normal. There is a hazy infiltrate at the left lung base seen through the left side of the heart. Slight peripheral haziness at the lung bases is most likely a overlying soft tissue. IMPRESSION: Focal infiltrate in the left lower lobe posterior medially.  Electronically Signed   By: Francene Boyers M.D.   On: 06/21/2019 18:53    Pending Labs Unresulted Labs (From admission, onward)    Start     Ordered   06/28/19 0500  Creatinine, serum  (enoxaparin (LOVENOX)    CrCl >/= 30 ml/min)  Weekly,   R    Comments: while on enoxaparin therapy    06/21/19 2046   06/22/19 0500  CBC with Differential/Platelet  Daily,   R     06/21/19 2046   06/22/19 0500  Comprehensive metabolic panel  Daily,   R     06/21/19 2046   06/22/19 0500  C-reactive protein  Daily,   R     06/21/19 2046   06/22/19 0500  D-dimer, quantitative (not at [facility])  Daily,   R     06/21/19 2046   06/22/19 0500  Magnesium  Daily,   R     06/21/19 2046   06/22/19 0500  Ferritin  Daily,   R     06/21/19 2046   06/22/19 0500  Phosphorus  Daily,   R     06/21/19 2046   06/21/19 2047  Culture, sputum-assessment  Once,   R     06/21/19 2046   06/21/19 2047  Legionella Pneumophila Serogp 1 Ur Ag  Once,   STAT     06/21/19 2046   06/21/19 2047  Strep pneumoniae urinary antigen  Once,   STAT     06/21/19 2047   06/21/19 2047  HIV Antibody (routine testing w rflx)  (HIV Antibody (Routine testing w reflex) panel)  Once,   STAT     06/21/19 2046   06/21/19 2047  ABO/Rh  Once,   STAT     06/21/19 2046   06/21/19 1700  Blood Culture (routine x 2)  BLOOD CULTURE X 2,   STAT     06/21/19 1706          Vitals/Pain Today's Vitals   06/21/19 2100 06/21/19 2130 06/21/19  2145 06/21/19 2200  BP: (!) 148/86 136/77  (!) 153/73  Pulse: 91  96 88  Resp: (!) 34 (!) 30 (!) 21 (!) 31  Temp:      TempSrc:      SpO2: 92%  90% 91%  Weight:      Height:      PainSc:        Isolation Precautions Airborne and Contact precautions  Medications Medications  cefTRIAXone (ROCEPHIN) 2 g in sodium chloride 0.9 % 100 mL IVPB (2 g Intravenous New Bag/Given 06/21/19 2217)  azithromycin (ZITHROMAX) 500 mg in sodium chloride 0.9 % 250 mL IVPB (has no administration in time range)  enoxaparin  (LOVENOX) injection 40 mg (40 mg Subcutaneous Given 06/21/19 2141)  dexamethasone (DECADRON) injection 6 mg (has no administration in time range)  guaiFENesin-dextromethorphan (ROBITUSSIN DM) 100-10 MG/5ML syrup 10 mL (has no administration in time range)  chlorpheniramine-HYDROcodone (TUSSIONEX) 10-8 MG/5ML suspension 5 mL (has no administration in time range)  acetaminophen (TYLENOL) tablet 650 mg (has no administration in time range)  HYDROcodone-acetaminophen (NORCO/VICODIN) 5-325 MG per tablet 1-2 tablet (has no administration in time range)  ondansetron (ZOFRAN) tablet 4 mg (has no administration in time range)    Or  ondansetron (ZOFRAN) injection 4 mg (has no administration in time range)  remdesivir 200 mg in sodium chloride 0.9 % 250 mL IVPB (0 mg Intravenous Stopped 06/21/19 2211)    Followed by  remdesivir 100 mg in sodium chloride 0.9 % 250 mL IVPB (has no administration in time range)  famotidine (PEPCID) tablet 20 mg (20 mg Oral Given 06/21/19 2142)  dexamethasone (DECADRON) injection 6 mg (6 mg Intravenous Given 06/21/19 1950)    Mobility walks

## 2019-06-21 NOTE — ED Notes (Signed)
Report given to [facility] and was advised to call when transport arrives. Aldona Bar [phone]

## 2019-06-21 NOTE — ED Notes (Signed)
Non-rebreather applied at 15 L due to oxygen 88-91% on 5 L [place]. Now oxygen is in upper 90s, ranging from 95-97%.

## 2019-06-21 NOTE — ED Provider Notes (Signed)
[facility]-EMERGENCY DEPT Provider Note   CSN: [id number] Arrival date & time: 06/21/19  1557     History   Chief Complaint Chief Complaint  Patient presents with  . COVID +  . Shortness of Breath    HPI Nicholas Marks is a 63 y.o. male.     HPI Pt started with uri sx a couple of weeks ago.  He initially had sinus sx and took abx and steroids.  He tested positive for covid on 11/25.  He has had persistent fevers up to 102.  In the last couple of days he started to feel short of breath.  That increases with activity. No nausea or vomiting.  Some loose stools.   Appetite has been decreased.    History reviewed. No pertinent past medical history.  Patient Active Problem List   Diagnosis Date Noted  . Pneumonia due to COVID-19 virus 06/21/2019    Past Surgical History:  Procedure Laterality Date  . APPENDECTOMY          Home Medications    Prior to Admission medications   Medication Sig Start Date End Date Taking? Authorizing Provider  loratadine-pseudoephedrine (CLARITIN-D 24-HOUR) 10-240 MG 24 hr tablet Take 1 tablet by mouth daily as needed for allergies.    [provider]  oxyCODONE-acetaminophen (PERCOCET/ROXICET) 5-325 MG tablet Take 1-2 tablets by mouth every 4 (four) hours as needed for severe pain. 08/17/15   Raeford Razor, MD    Family History History reviewed. No pertinent family history.  Social History Social History   Tobacco Use  . Smoking status: Never Smoker  Substance Use Topics  . Alcohol use: Yes  . Drug use: Not on file     Allergies   Patient has no known allergies.   Review of Systems Review of Systems  All other systems reviewed and are negative.    Physical Exam Updated Vital Signs BP (!) 158/84   Pulse 92   Temp 98.2 F (36.8 C) (Oral)   Resp (!) 25   Ht 1.74 m (5' 8.5")   Wt 117.9 kg   SpO2 95%   BMI 38.96 kg/m   Physical Exam Vitals signs and nursing note reviewed.   Constitutional:      General: He is not in acute distress.    Appearance: He is well-developed.  HENT:     Head: Normocephalic and atraumatic.     Right Ear: External ear normal.     Left Ear: External ear normal.  Eyes:     General: No scleral icterus.       Right eye: No discharge.        Left eye: No discharge.     Conjunctiva/sclera: Conjunctivae normal.  Neck:     Musculoskeletal: Neck supple.     Trachea: No tracheal deviation.  Cardiovascular:     Rate and Rhythm: Normal rate and regular rhythm.  Pulmonary:     Effort: Pulmonary effort is normal. No respiratory distress.     Breath sounds: Normal breath sounds. No stridor. No wheezing or rales.  Abdominal:     General: Bowel sounds are normal. There is no distension.     Palpations: Abdomen is soft.     Tenderness: There is no abdominal tenderness. There is no guarding or rebound.  Musculoskeletal:        General: No tenderness.  Skin:    General: Skin is warm and dry.     Findings: No rash.  Neurological:  Mental Status: He is alert.     Cranial Nerves: No cranial nerve deficit (no facial droop, extraocular movements intact, no slurred speech).     Sensory: No sensory deficit.     Motor: No abnormal muscle tone or seizure activity.     Coordination: Coordination normal.      ED Treatments / Results  Labs (all labs ordered are listed, but only abnormal results are displayed) Labs Reviewed  CBC WITH DIFFERENTIAL/PLATELET - Abnormal; Notable for the following components:      Result Value   WBC 13.5 (*)    Neutro Abs 11.1 (*)    Abs Immature Granulocytes 0.19 (*)    All other components within normal limits  COMPREHENSIVE METABOLIC PANEL - Abnormal; Notable for the following components:   Sodium 134 (*)    Chloride 97 (*)    Glucose, Bld 131 (*)    Albumin 3.1 (*)    AST 48 (*)    ALT 55 (*)    All other components within normal limits  D-DIMER, QUANTITATIVE (NOT AT [facility]) - Abnormal; Notable for the  following components:   D-Dimer, Quant 2.38 (*)    All other components within normal limits  LACTATE DEHYDROGENASE - Abnormal; Notable for the following components:   LDH 417 (*)    All other components within normal limits  FERRITIN - Abnormal; Notable for the following components:   Ferritin 1,867 (*)    All other components within normal limits  FIBRINOGEN - Abnormal; Notable for the following components:   Fibrinogen >800 (*)    All other components within normal limits  C-REACTIVE PROTEIN - Abnormal; Notable for the following components:   CRP 17.6 (*)    All other components within normal limits  CULTURE, BLOOD (ROUTINE X 2)  CULTURE, BLOOD (ROUTINE X 2)  LACTIC ACID, PLASMA  PROCALCITONIN  TRIGLYCERIDES    EKG EKG Interpretation  Date/Time:  Tuesday June 21 2019 16:20:04 EST Ventricular Rate:  89 PR Interval:    QRS Duration: 92 QT Interval:  372 QTC Calculation: 453 R Axis:   43 Text Interpretation: Sinus rhythm No old tracing to compare Confirmed by Dorie Rank [phone]) on 06/21/2019 5:49:41 PM   Radiology Dg Chest Port 1 View  Result Date: 06/21/2019 CLINICAL DATA:  COVID-19. Hypoxia. EXAM: PORTABLE CHEST 1 VIEW COMPARISON:  None. FINDINGS: The heart size and pulmonary vascularity are normal. There is a hazy infiltrate at the left lung base seen through the left side of the heart. Slight peripheral haziness at the lung bases is most likely a overlying soft tissue. IMPRESSION: Focal infiltrate in the left lower lobe posterior medially. Electronically Signed   By: Lorriane Shire M.D.   On: 06/21/2019 18:53    Procedures .Critical Care Performed by: Dorie Rank, MD Authorized by: Dorie Rank, MD   Critical care provider statement:    Critical care time (minutes):  35   Critical care was time spent personally by me on the following activities:  Discussions with consultants, evaluation of patient's response to treatment, examination of patient, ordering and  performing treatments and interventions, ordering and review of laboratory studies, ordering and review of radiographic studies, pulse oximetry, re-evaluation of patient's condition, obtaining history from patient or surrogate and review of old charts   (including critical care time)  Medications Ordered in ED Medications  dexamethasone (DECADRON) injection 6 mg (has no administration in time range)     Initial Impression / Assessment and Plan / ED Course  I have reviewed the triage vital signs and the nursing notes.  Pertinent labs & imaging results that were available during my care of the patient were reviewed by me and considered in my medical decision making (see chart for details).  Clinical Course as of Jun 20 1934  Tue Jun 21, 2019  1720 Patient ambulated himself to the ED bed.  His oxygen saturation dropped into the 70s.  Patient currently now is on nasal cannula oxygen 4 L with sats in the mid 90s.   [JK]  1934 Laboratory tests reviewed.  Inflammatory markers elevated consistent with his Covid diagnosis.  D-dimer elevated but doubt pulmonary embolism.  X-ray does show infiltrate accounting for his hypoxia.   [JK]    Clinical Course User Index [JK] Linwood DibblesKnapp, Khamiyah Grefe, MD      Doris Cheadleobert L Dickenson was evaluated in Emergency Department on 06/21/2019 for the symptoms described in the history of present illness. He was evaluated in the context of the global COVID-19 pandemic, which necessitated consideration that the patient might be at risk for infection with the SARS-CoV-2 virus that causes COVID-19. Institutional protocols and algorithms that pertain to the evaluation of patients at risk for COVID-19 are in a state of rapid change based on information released by regulatory bodies including the CDC and federal and state organizations. These policies and algorithms were followed during the patient's care in the ED.  Patient presented to ED for evaluation of worsening shortness of breath.  Patient  has a confirmed COVID-19 diagnosis.  Chest x-ray is now showing pneumonia.  He is requiring oxygen.  Patient was given IV Decadron.  Plan admission to the hospital for further treatment.  Final Clinical Impressions(s) / ED Diagnoses   Final diagnoses:  Pneumonia due to COVID-19 virus      Linwood DibblesKnapp, Gyneth Hubka, MD 06/21/19 Barry Brunner1935

## 2019-06-22 ENCOUNTER — Encounter (HOSPITAL_COMMUNITY): Payer: Self-pay

## 2019-06-22 ENCOUNTER — Inpatient Hospital Stay (HOSPITAL_COMMUNITY): Payer: BC Managed Care – PPO

## 2019-06-22 LAB — CBC WITH DIFFERENTIAL/PLATELET
Abs Immature Granulocytes: 0.16 10*3/uL — ABNORMAL HIGH (ref 0.00–0.07)
Basophils Absolute: 0.1 10*3/uL (ref 0.0–0.1)
Basophils Relative: 1 %
Eosinophils Absolute: 0 10*3/uL (ref 0.0–0.5)
Eosinophils Relative: 0 %
HCT: 46.6 % (ref 39.0–52.0)
Hemoglobin: 15.7 g/dL (ref 13.0–17.0)
Immature Granulocytes: 2 %
Lymphocytes Relative: 10 %
Lymphs Abs: 1 10*3/uL (ref 0.7–4.0)
MCH: 30.7 pg (ref 26.0–34.0)
MCHC: 33.7 g/dL (ref 30.0–36.0)
MCV: 91 fL (ref 80.0–100.0)
Monocytes Absolute: 0.6 10*3/uL (ref 0.1–1.0)
Monocytes Relative: 6 %
Neutro Abs: 8.1 10*3/uL — ABNORMAL HIGH (ref 1.7–7.7)
Neutrophils Relative %: 81 %
Platelets: 267 10*3/uL (ref 150–400)
RBC: 5.12 MIL/uL (ref 4.22–5.81)
RDW: 12.5 % (ref 11.5–15.5)
WBC: 9.8 10*3/uL (ref 4.0–10.5)
nRBC: 0 % (ref 0.0–0.2)

## 2019-06-22 LAB — COMPREHENSIVE METABOLIC PANEL
ALT: 63 U/L — ABNORMAL HIGH (ref 0–44)
AST: 50 U/L — ABNORMAL HIGH (ref 15–41)
Albumin: 3 g/dL — ABNORMAL LOW (ref 3.5–5.0)
Alkaline Phosphatase: 60 U/L (ref 38–126)
Anion gap: 13 (ref 5–15)
BUN: 16 mg/dL (ref 8–23)
CO2: 22 mmol/L (ref 22–32)
Calcium: 8.4 mg/dL — ABNORMAL LOW (ref 8.9–10.3)
Chloride: 103 mmol/L (ref 98–111)
Creatinine, Ser: 0.84 mg/dL (ref 0.61–1.24)
GFR calc Af Amer: >60 mL/min (ref >60)
GFR calc non Af Amer: >60 mL/min (ref >60)
Glucose, Bld: 172 mg/dL — ABNORMAL HIGH (ref 70–99)
Potassium: 4.5 mmol/L (ref 3.5–5.1)
Sodium: 138 mmol/L (ref 135–145)
Total Bilirubin: 0.6 mg/dL (ref 0.3–1.2)
Total Protein: 6.8 g/dL (ref 6.5–8.1)

## 2019-06-22 LAB — GLUCOSE, CAPILLARY
Glucose-Capillary: 228 mg/dL — ABNORMAL HIGH (ref 70–99)
Glucose-Capillary: 240 mg/dL — ABNORMAL HIGH (ref 70–99)

## 2019-06-22 LAB — PROCALCITONIN: Procalcitonin: 0.2 ng/mL

## 2019-06-22 LAB — SAMPLE TO BLOOD BANK

## 2019-06-22 LAB — C-REACTIVE PROTEIN: CRP: 16.5 mg/dL — ABNORMAL HIGH (ref <1.0)

## 2019-06-22 LAB — HIV ANTIBODY (ROUTINE TESTING W REFLEX): HIV Screen 4th Generation wRfx: NONREACTIVE

## 2019-06-22 LAB — HEMOGLOBIN A1C
Hgb A1c MFr Bld: 7.1 % — ABNORMAL HIGH (ref 4.8–5.6)
Mean Plasma Glucose: 157.07 mg/dL

## 2019-06-22 LAB — STREP PNEUMONIAE URINARY ANTIGEN: Strep Pneumo Urinary Antigen: NEGATIVE

## 2019-06-22 LAB — MAGNESIUM: Magnesium: 2 mg/dL (ref 1.7–2.4)

## 2019-06-22 LAB — FERRITIN: Ferritin: 1397 ng/mL — ABNORMAL HIGH (ref 24–336)

## 2019-06-22 LAB — ABO/RH
ABO/RH(D): A POS
ABO/RH(D): A POS

## 2019-06-22 LAB — PHOSPHORUS: Phosphorus: 3.1 mg/dL (ref 2.5–4.6)

## 2019-06-22 LAB — D-DIMER, QUANTITATIVE: D-Dimer, Quant: 1.74 ug/mL-FEU — ABNORMAL HIGH (ref 0.00–0.50)

## 2019-06-22 MED ORDER — FUROSEMIDE 10 MG/ML IJ SOLN
40.0000 mg | Freq: Once | INTRAMUSCULAR | Status: AC
  Administered 2019-06-22: 40 mg via INTRAVENOUS
  Filled 2019-06-22: qty 4

## 2019-06-22 MED ORDER — INSULIN ASPART 100 UNIT/ML ~~LOC~~ SOLN
0.0000 [IU] | Freq: Three times a day (TID) | SUBCUTANEOUS | Status: DC
  Administered 2019-06-22: 3 [IU] via SUBCUTANEOUS

## 2019-06-22 MED ORDER — SODIUM CHLORIDE 0.9% IV SOLUTION
Freq: Once | INTRAVENOUS | Status: AC
  Administered 2019-06-22: 15:00:00 via INTRAVENOUS

## 2019-06-22 MED ORDER — ENOXAPARIN SODIUM 60 MG/0.6ML ~~LOC~~ SOLN
60.0000 mg | SUBCUTANEOUS | Status: DC
  Administered 2019-06-22 – 2019-06-24 (×3): 60 mg via SUBCUTANEOUS
  Filled 2019-06-22 (×3): qty 0.6

## 2019-06-22 MED ORDER — METHYLPREDNISOLONE SODIUM SUCC 125 MG IJ SOLR
60.0000 mg | Freq: Two times a day (BID) | INTRAMUSCULAR | Status: AC
  Administered 2019-06-22 – 2019-06-25 (×8): 60 mg via INTRAVENOUS
  Filled 2019-06-22 (×9): qty 2

## 2019-06-22 MED ORDER — INSULIN ASPART 100 UNIT/ML ~~LOC~~ SOLN
0.0000 [IU] | Freq: Every day | SUBCUTANEOUS | Status: DC
  Administered 2019-06-22: 2 [IU] via SUBCUTANEOUS

## 2019-06-22 NOTE — ED Notes (Signed)
[employer] notified of need for transport to [facility].

## 2019-06-22 NOTE — ED Notes (Signed)
[facility] here for transport to [place].

## 2019-06-22 NOTE — Progress Notes (Addendum)
PROGRESS NOTE    Nicholas Marks  ZOX:[id number] DOB: [birth year] DOA: 06/21/2019 PCP: Patient, No Pcp Per   Brief Narrative:  Nicholas Marks is Nicholas Marks 63 y.o. male who denies any significant past medical history now presents emergency department for evaluation of progressive shortness of breath and low oxygen saturations at home.  Patient reports that he developed upper respiratory symptoms, fevers, headache, and general malaise little over Nicholas Marks week ago, was found to be positive for COVID-19 (outpatient test from 06/13/2019 is posted in media tab in electronic medical record), started an antibiotic and steroid, has been taking acetaminophen at home, reports resolution of fevers and headache over the past couple days, but has had progressive dyspnea.  Patient has home pulse oximeter and noted that his saturation was dipping into the mid to upper 70s with minimal exertion today.  He has had Maclin Guerrette cough that is productive of thick dark sputum, denies chest pain or leg swelling, denies leg tenderness, and denies any abdominal pain.  ED Course: Upon arrival to the ED, patient is found to be afebrile, saturating 78% on room air, tachypneic to 30, and with stable blood pressure.  EKG features Azarian Starace sinus rhythm and chest x-ray is concerning for focal infiltrate in the left lower lobe.  Chemistry panel is notable for slight hyponatremia and mild elevation in transaminases.  CBC features Dorell Gatlin leukocytosis to 13,500.  Lactic acid is normal.  D-dimer is elevated to 2.38.  Blood cultures were collected in the emergency department and the patient was treated with Decadron and supplemental oxygen.  Assessment & Plan:   Principal Problem:   Pneumonia due to COVID-19 virus  1. Pneumonia; COVID-19; acute hypoxic respiratory failure  - Currently requiring 5-6 L by [place], satting in high 80's, appears comfortable - CXR on 12/1 concerning for focal infiltrate in LLL -> started on abx for possible superimposed bacterial pneumonia - CXR  12/2 with bilateral airspace disease with progression on L  - Continue steroids, remdesivir - discussed risks/benefits of plasma.  Patient given consent form which he's signed.  Discussed with wife as well. - will hold off on actemra at this time given concern for possible bacterial pneumonia at presentation, though would consider this if he were to worsen.  Pt was notably taking steroids and abx prior to admission. - I/O, daily weights - consider lasix as tolerated - prone as able, OOB as able, IS - Daily inflammatory labs - pending for today  COVID-19 Labs  Recent Labs    06/21/19 1721  DDIMER 2.38*  FERRITIN 1,867*  LDH 417*  CRP 17.6*    No results found for: SARSCOV2NAA  2. Elevated d-dimer  - D-dimer is elevated to 2.38 in ED  - There is no chest pain or hemoptysis, and no evidence for DVT, and this is likely secondary to the acute infectious illness  - Continue to treat pneumonia and trend   Hyperglycemia: follow A1c, add insulin regimen as needed.  BG 172 on presentation.  No hx DM.  Start sensitive SSI and follow.  DVT prophylaxis: lovenox Code Status: full  Family Communication: wife over phone 12/2 Disposition Plan: pending further improvement  Consultants:   none  Procedures:   none  Antimicrobials:  Anti-infectives (From admission, onward)   Start     Dose/Rate Route Frequency Ordered Stop   06/22/19 1000  remdesivir 100 mg in sodium chloride 0.9 % 250 mL IVPB     100 mg 500 mL/hr over 30 Minutes Intravenous Every  24 hours 06/21/19 1956 06/26/19 0959   06/21/19 2100  cefTRIAXone (ROCEPHIN) 2 g in sodium chloride 0.9 % 100 mL IVPB     2 g 200 mL/hr over 30 Minutes Intravenous Every 24 hours 06/21/19 2047 06/26/19 2059   06/21/19 2100  azithromycin (ZITHROMAX) 500 mg in sodium chloride 0.9 % 250 mL IVPB     500 mg 250 mL/hr over 60 Minutes Intravenous Every 24 hours 06/21/19 2047 06/26/19 2059   06/21/19 2100  remdesivir 200 mg in sodium chloride 0.9 %  250 mL IVPB     200 mg 500 mL/hr over 30 Minutes Intravenous Once 06/21/19 1956 06/21/19 2211     Subjective: Feels ok at this time, but feels like he'd be SOB with exertion No CP, N/V/D, change in taste or smell  Objective: Vitals:   06/22/19 0100 06/22/19 0147 06/22/19 0503 06/22/19 0736  BP: (!) 146/68 (!) 157/84 (!) 148/91 (!) 150/90  Pulse: 75 90 86 83  Resp: (!) 25 (!) 22 (!) 22 (!) 24  Temp:  (!) 97.5 F (36.4 C) 97.6 F (36.4 C) 98.5 F (36.9 C)  TempSrc:  Axillary Axillary Oral  SpO2: (!) 86% (!) 89% (!) 85% 91%  Weight:  117.9 kg    Height:  5' 8.5" (1.74 m)      Intake/Output Summary (Last 24 hours) at 06/22/2019 1136 Last data filed at 06/22/2019 1100 Gross per 24 hour  Intake 1282.84 ml  Output 600 ml  Net 682.84 ml   Filed Weights   06/21/19 1600 06/22/19 0147  Weight: 117.9 kg 117.9 kg    Examination:  General exam: Appears calm and comfortable  Respiratory system: Clear to auscultation. Slightly increased WOB on 5-6 L [place] satting in high 80's and low 90's during our conversation. Cardiovascular system: RRR Gastrointestinal system: Abdomen is nondistended, soft and nontender.  Central nervous system: Alert and oriented. No focal neurological deficits. Extremities: no LEE Skin: No rashes, lesions or ulcers Psychiatry: Judgement and insight appear normal. Mood & affect appropriate.     Data Reviewed: I have personally reviewed following labs and imaging studies  CBC: Recent Labs  Lab 06/21/19 1721 06/22/19 0805  WBC 13.5* 9.8  NEUTROABS 11.1* 8.1*  HGB 16.2 15.7  HCT 48.4 46.6  MCV 90.8 91.0  PLT 277 267   Basic Metabolic Panel: Recent Labs  Lab 06/21/19 1721  NA 134*  K 3.6  CL 97*  CO2 24  GLUCOSE 131*  BUN 17  CREATININE 0.92  CALCIUM 9.2   GFR: Estimated Creatinine Clearance: 102 mL/min (by C-G formula based on SCr of 0.92 mg/dL). Liver Function Tests: Recent Labs  Lab 06/21/19 1721  AST 48*  ALT 55*  ALKPHOS 59    BILITOT 1.0  PROT 7.4  ALBUMIN 3.1*   No results for input(s): LIPASE, AMYLASE in the last 168 hours. No results for input(s): AMMONIA in the last 168 hours. Coagulation Profile: No results for input(s): INR, PROTIME in the last 168 hours. Cardiac Enzymes: No results for input(s): CKTOTAL, CKMB, CKMBINDEX, TROPONINI in the last 168 hours. BNP (last 3 results) No results for input(s): PROBNP in the last 8760 hours. HbA1C: No results for input(s): HGBA1C in the last 72 hours. CBG: No results for input(s): GLUCAP in the last 168 hours. Lipid Profile: Recent Labs    06/21/19 1721  TRIG 145   Thyroid Function Tests: No results for input(s): TSH, T4TOTAL, FREET4, T3FREE, THYROIDAB in the last 72 hours. Anemia Panel: Recent Labs  06/21/19 1721  FERRITIN 1,867*   Sepsis Labs: Recent Labs  Lab 06/21/19 1721  PROCALCITON 0.32  LATICACIDVEN 1.8    Recent Results (from the past 240 hour(s))  Blood Culture (routine x 2)     Status: None (Preliminary result)   Collection Time: 06/21/19  5:21 PM   Specimen: BLOOD LEFT FOREARM  Result Value Ref Range Status   Specimen Description   Final    BLOOD LEFT FOREARM Performed at [facility] [address] [place], [place] [address]    Special Requests   Final    BOTTLES DRAWN AEROBIC AND ANAEROBIC Blood Culture adequate volume Performed at [facility] [address], [place], [place] [address]    Culture   Final    NO GROWTH < 12 HOURS Performed at [facility] [address], [place], [place] [address]    Report Status PENDING  Incomplete  Blood Culture (routine x 2)     Status: None (Preliminary result)   Collection Time: 06/21/19  5:21 PM   Specimen: BLOOD  Result Value Ref Range Status   Specimen Description   Final    BLOOD BLOOD LEFT HAND Performed at [facility] [address] [place], [place] [address]    Special Requests   Final    BOTTLES  DRAWN AEROBIC AND ANAEROBIC Blood Culture adequate volume Performed at [facility] [address], [place], [place] [address]    Culture   Final    NO GROWTH < 12 HOURS Performed at [facility] [address], [place], [place] [address]    Report Status PENDING  Incomplete         Radiology Studies: Dg Chest Port 1 View  Result Date: 06/22/2019 CLINICAL DATA:  Hypoxia.  COVID-19 positive EXAM: PORTABLE CHEST 1 VIEW COMPARISON:  12 and 2020 FINDINGS: Progression of left mid lung airspace disease. Left lower lobe airspace disease remains. Probable mild right lower lobe airspace disease. No effusion. Cardiac enlargement. IMPRESSION: Bilateral airspace disease with progression on the left. Electronically Signed   By: Franchot Gallo M.D.   On: 06/22/2019 09:19   Dg Chest Port 1 View  Result Date: 06/21/2019 CLINICAL DATA:  COVID-19. Hypoxia. EXAM: PORTABLE CHEST 1 VIEW COMPARISON:  None. FINDINGS: The heart size and pulmonary vascularity are normal. There is Maleak Brazzel hazy infiltrate at the left lung base seen through the left side of the heart. Slight peripheral haziness at the lung bases is most likely Shadara Lopez overlying soft tissue. IMPRESSION: Focal infiltrate in the left lower lobe posterior medially. Electronically Signed   By: Lorriane Shire M.D.   On: 06/21/2019 18:53        Scheduled Meds:  sodium chloride   Intravenous Once   enoxaparin (LOVENOX) injection  40 mg Subcutaneous Q24H   famotidine  20 mg Oral BID   methylPREDNISolone (SOLU-MEDROL) injection  60 mg Intravenous Q12H   Continuous Infusions:  azithromycin Stopped (06/22/19 0000)   cefTRIAXone (ROCEPHIN)  IV Stopped (06/21/19 2247)   remdesivir 100 mg in NS 250 mL 100 mg (06/22/19 0947)     LOS: 1 day    Time spent: over 49 min    Fayrene Helper, MD [facility] Pager AMION  If 7PM-7AM, please contact night-coverage [email or web address] Password [facility] 06/22/2019, 11:36 AM

## 2019-06-23 ENCOUNTER — Inpatient Hospital Stay (HOSPITAL_COMMUNITY): Payer: BC Managed Care – PPO

## 2019-06-23 LAB — PROCALCITONIN: Procalcitonin: 0.18 ng/mL

## 2019-06-23 LAB — COMPREHENSIVE METABOLIC PANEL
ALT: 76 U/L — ABNORMAL HIGH (ref 0–44)
AST: 43 U/L — ABNORMAL HIGH (ref 15–41)
Albumin: 3 g/dL — ABNORMAL LOW (ref 3.5–5.0)
Alkaline Phosphatase: 61 U/L (ref 38–126)
Anion gap: 17 — ABNORMAL HIGH (ref 5–15)
BUN: 24 mg/dL — ABNORMAL HIGH (ref 8–23)
CO2: 20 mmol/L — ABNORMAL LOW (ref 22–32)
Calcium: 8.8 mg/dL — ABNORMAL LOW (ref 8.9–10.3)
Chloride: 101 mmol/L (ref 98–111)
Creatinine, Ser: 0.88 mg/dL (ref 0.61–1.24)
GFR calc Af Amer: >60 mL/min (ref >60)
GFR calc non Af Amer: >60 mL/min (ref >60)
Glucose, Bld: 230 mg/dL — ABNORMAL HIGH (ref 70–99)
Potassium: 4.4 mmol/L (ref 3.5–5.1)
Sodium: 138 mmol/L (ref 135–145)
Total Bilirubin: 0.4 mg/dL (ref 0.3–1.2)
Total Protein: 6.9 g/dL (ref 6.5–8.1)

## 2019-06-23 LAB — BPAM FFP
Blood Product Expiration Date: 202012031253
ISSUE DATE / TIME: 202012021255
Unit Type and Rh: 6200

## 2019-06-23 LAB — GLUCOSE, CAPILLARY
Glucose-Capillary: 252 mg/dL — ABNORMAL HIGH (ref 70–99)
Glucose-Capillary: 368 mg/dL — ABNORMAL HIGH (ref 70–99)
Glucose-Capillary: 409 mg/dL — ABNORMAL HIGH (ref 70–99)

## 2019-06-23 LAB — PREPARE FRESH FROZEN PLASMA

## 2019-06-23 LAB — CBC WITH DIFFERENTIAL/PLATELET
Abs Immature Granulocytes: 0.6 10*3/uL — ABNORMAL HIGH (ref 0.00–0.07)
Basophils Absolute: 0.1 10*3/uL (ref 0.0–0.1)
Basophils Relative: 1 %
Eosinophils Absolute: 0 10*3/uL (ref 0.0–0.5)
Eosinophils Relative: 0 %
HCT: 46 % (ref 39.0–52.0)
Hemoglobin: 15.2 g/dL (ref 13.0–17.0)
Immature Granulocytes: 4 %
Lymphocytes Relative: 7 %
Lymphs Abs: 0.9 10*3/uL (ref 0.7–4.0)
MCH: 30.5 pg (ref 26.0–34.0)
MCHC: 33 g/dL (ref 30.0–36.0)
MCV: 92.2 fL (ref 80.0–100.0)
Monocytes Absolute: 0.7 10*3/uL (ref 0.1–1.0)
Monocytes Relative: 5 %
Neutro Abs: 12 10*3/uL — ABNORMAL HIGH (ref 1.7–7.7)
Neutrophils Relative %: 83 %
Platelets: 280 10*3/uL (ref 150–400)
RBC: 4.99 MIL/uL (ref 4.22–5.81)
RDW: 12.5 % (ref 11.5–15.5)
WBC: 14.3 10*3/uL — ABNORMAL HIGH (ref 4.0–10.5)
nRBC: 0 % (ref 0.0–0.2)

## 2019-06-23 LAB — FERRITIN: Ferritin: 1222 ng/mL — ABNORMAL HIGH (ref 24–336)

## 2019-06-23 LAB — BRAIN NATRIURETIC PEPTIDE: B Natriuretic Peptide: 48.6 pg/mL (ref 0.0–100.0)

## 2019-06-23 LAB — PHOSPHORUS: Phosphorus: 2.8 mg/dL (ref 2.5–4.6)

## 2019-06-23 LAB — D-DIMER, QUANTITATIVE: D-Dimer, Quant: 1.78 ug/mL-FEU — ABNORMAL HIGH (ref 0.00–0.50)

## 2019-06-23 LAB — MAGNESIUM: Magnesium: 2.2 mg/dL (ref 1.7–2.4)

## 2019-06-23 LAB — LEGIONELLA PNEUMOPHILA SEROGP 1 UR AG: L. pneumophila Serogp 1 Ur Ag: NEGATIVE

## 2019-06-23 LAB — C-REACTIVE PROTEIN: CRP: 11.8 mg/dL — ABNORMAL HIGH (ref <1.0)

## 2019-06-23 MED ORDER — INSULIN DETEMIR 100 UNIT/ML ~~LOC~~ SOLN
0.0750 [IU]/kg | Freq: Two times a day (BID) | SUBCUTANEOUS | Status: DC
  Administered 2019-06-23: 9 [IU] via SUBCUTANEOUS
  Filled 2019-06-23 (×2): qty 0.09

## 2019-06-23 MED ORDER — INSULIN DETEMIR 100 UNIT/ML ~~LOC~~ SOLN
9.0000 [IU] | Freq: Two times a day (BID) | SUBCUTANEOUS | Status: DC
  Administered 2019-06-23 – 2019-06-24 (×3): 9 [IU] via SUBCUTANEOUS
  Filled 2019-06-23 (×4): qty 0.09

## 2019-06-23 MED ORDER — INSULIN ASPART 100 UNIT/ML ~~LOC~~ SOLN
0.0000 [IU] | SUBCUTANEOUS | Status: DC
  Administered 2019-06-23 – 2019-06-24 (×3): 20 [IU] via SUBCUTANEOUS
  Administered 2019-06-24: 4 [IU] via SUBCUTANEOUS
  Administered 2019-06-24: 11 [IU] via SUBCUTANEOUS
  Administered 2019-06-24: 15 [IU] via SUBCUTANEOUS
  Administered 2019-06-24: 4 [IU] via SUBCUTANEOUS
  Administered 2019-06-25: 11 [IU] via SUBCUTANEOUS
  Administered 2019-06-25: 20 [IU] via SUBCUTANEOUS
  Administered 2019-06-25 (×2): 11 [IU] via SUBCUTANEOUS
  Administered 2019-06-25 (×2): 20 [IU] via SUBCUTANEOUS
  Administered 2019-06-26: 15 [IU] via SUBCUTANEOUS
  Administered 2019-06-26: 20 [IU] via SUBCUTANEOUS
  Administered 2019-06-26 (×2): 4 [IU] via SUBCUTANEOUS
  Administered 2019-06-26: 7 [IU] via SUBCUTANEOUS
  Administered 2019-06-26: 20 [IU] via SUBCUTANEOUS
  Administered 2019-06-26: 11 [IU] via SUBCUTANEOUS
  Administered 2019-06-27: 20 [IU] via SUBCUTANEOUS
  Administered 2019-06-27: 3 [IU] via SUBCUTANEOUS
  Administered 2019-06-27: 15 [IU] via SUBCUTANEOUS
  Administered 2019-06-27: 0 [IU] via SUBCUTANEOUS
  Administered 2019-06-27: 20 [IU] via SUBCUTANEOUS
  Administered 2019-06-27: 4 [IU] via SUBCUTANEOUS
  Administered 2019-06-28: 7 [IU] via SUBCUTANEOUS

## 2019-06-23 MED ORDER — FUROSEMIDE 10 MG/ML IJ SOLN
40.0000 mg | Freq: Four times a day (QID) | INTRAMUSCULAR | Status: AC
  Administered 2019-06-23 (×2): 40 mg via INTRAVENOUS
  Filled 2019-06-23 (×2): qty 4

## 2019-06-23 MED ORDER — INSULIN ASPART 100 UNIT/ML ~~LOC~~ SOLN
0.0000 [IU] | SUBCUTANEOUS | Status: DC
  Administered 2019-06-23: 15 [IU] via SUBCUTANEOUS
  Administered 2019-06-23: 8 [IU] via SUBCUTANEOUS

## 2019-06-23 MED ORDER — ORAL CARE MOUTH RINSE
15.0000 mL | Freq: Two times a day (BID) | OROMUCOSAL | Status: DC
  Administered 2019-06-23 – 2019-06-28 (×10): 15 mL via OROMUCOSAL

## 2019-06-23 MED ORDER — INSULIN ASPART 100 UNIT/ML ~~LOC~~ SOLN
3.0000 [IU] | Freq: Three times a day (TID) | SUBCUTANEOUS | Status: DC
  Administered 2019-06-23: 3 [IU] via SUBCUTANEOUS

## 2019-06-23 NOTE — Progress Notes (Signed)
Inpatient Diabetes Program Recommendations  AACE/ADA: New Consensus Statement on Inpatient Glycemic Control (2015)  Target Ranges:  Prepandial:   less than 140 mg/dL      Peak postprandial:   less than 180 mg/dL (1-2 hours)      Critically ill patients:  140 - 180 mg/dL   Lab Results  Component Value Date   GLUCAP 252 (H) 06/23/2019   HGBA1C 7.1 (H) 06/22/2019    Review of Glycemic Control  Diabetes history: None  Current orders for Inpatient glycemic control:  Levemir 9 units bid Novolog 0-15 units Q4 hours  Solumedrol 60 Q12 hours A1c 7.1% on 12/2 pt had 1 dose decadron prior to lab draw.  BUN/Creat: 24/0.88  Inpatient Diabetes Program Recommendations:    Noted COVID glycemic order set utilized. Will watch pt on current regimen.  Pt will need recheck of A1c outpatient with PCP. Will follow glucose trends to see what glucose requirements are needed prior to d/c.  Thanks,  Tama Headings RN, MSN, BC-ADM Inpatient Diabetes Coordinator Team Pager [phone] (8a-5p)

## 2019-06-23 NOTE — Evaluation (Signed)
Physical Therapy Evaluation Patient Details Name: Nicholas Marks MRN: [id number] DOB: November 05, 1955 Today's Date: 06/23/2019   History of Present Illness  63 y.o. male who denies any sig PMHx, presented to ED w/  progressive SOB and hypoxia at home. He reports upper respiratory sx, fevers, headache, and general malaise for over  1 week, was found to beCOVID-19 + (outpatient test) 06/13/2019. was started on antibiotic and steroid w/ resolution of fevers and headache, but c/o progressive dyspnea and contiued hypoxia at home. Pt admitted w/ PNA sec to COVID.  Clinical Impression   Pt admitted with above diagnosis. PTA living home independently with spouse (she also had COVID recently). Pt hs 26 steps to get to second floor of home, but states may stay on ground floor if needed.  Pt currently with functional limitations due to the deficits listed below (see PT Problem List). Pt presents with decreased strength, independence, activity tolerance, balance and coordination. Pt was able to complete most tasks with min a and HHA, ambulated in room 68ft x 2 w/ seated rest break between each. With each attempt was on 6L/min via [place] and both times desat to high 70s, takes approx 5 mins to recover to high 80s with cues pursed lip breathing and some distraction. Sats measured via finger probe.  Pt will benefit from skilled PT to increase their independence and safety with mobility to allow discharge to the venue listed below.       Follow Up Recommendations Home health PT    Equipment Recommendations  None recommended by PT    Recommendations for Other Services OT consult     Precautions / Restrictions Precautions Precautions: Fall Precaution Comments: desats w/ activity Restrictions Weight Bearing Restrictions: No      Mobility  Bed Mobility               General bed mobility comments: Pt sitting in recliner at therapist arrival  Transfers Overall transfer level: Needs assistance Equipment  used: 1 person hand held assist Transfers: Sit to/from Stand Sit to Stand: Min assist            Ambulation/Gait Ambulation/Gait assistance: Min assist Gait Distance (Feet): 60 Feet Assistive device: 1 person hand held assist Gait Pattern/deviations: Step-through pattern Gait velocity: slow   General Gait Details: ambulates in room with HHA and min-min guard assist, on 6L/min via [place] desats to high 70s with ambulation  Stairs            Wheelchair Mobility    Modified Rankin (Stroke Patients Only)       Balance Overall balance assessment: Needs assistance Sitting-balance support: Feet unsupported Sitting balance-Leahy Scale: Fair     Standing balance support: During functional activity;Single extremity supported Standing balance-Leahy Scale: Fair                               Pertinent Vitals/Pain Pain Assessment: Faces Faces Pain Scale: Hurts little more Pain Location: w/ general increased WOB Pain Descriptors / Indicators: Guarding;Grimacing    Home Living Family/patient expects to be discharged to:: Private residence Living Arrangements: Spouse/significant other Available Help at Discharge: Family Type of Home: House Home Access: Stairs to enter Entrance Stairs-Rails: Left Entrance Stairs-Number of Steps: 4 from garage Home Layout: Two level;Able to live on main level with bedroom/bathroom Home Equipment: Gilford Rile - 2 wheels;Cane - single point(sleep number bed, )      Prior Function Level of Independence: Independent  Hand Dominance        Extremity/Trunk Assessment   Upper Extremity Assessment Upper Extremity Assessment: Generalized weakness    Lower Extremity Assessment Lower Extremity Assessment: Generalized weakness       Communication   Communication: No difficulties  Cognition Arousal/Alertness: Awake/alert Behavior During Therapy: WFL for tasks assessed/performed Overall Cognitive Status: Within  Functional Limits for tasks assessed                                        General Comments      Exercises Other Exercises Other Exercises: incentive spirometer x 10 able to pull w/ cues Other Exercises: flutter valve x 10    Assessment/Plan    PT Assessment Patient needs continued PT services  PT Problem List Decreased strength;Decreased activity tolerance;Decreased balance;Decreased mobility;Decreased coordination;Decreased safety awareness       PT Treatment Interventions DME instruction;Gait training;Stair training;Functional mobility training;Therapeutic activities;Therapeutic exercise;Balance training;Neuromuscular re-education;Patient/family education    PT Goals (Current goals can be found in the Care Plan section)  Acute Rehab PT Goals Patient Stated Goal: to return [facility] at [facility] PT Goal Formulation: With patient Time For Goal Achievement: 07/07/19 Potential to Achieve Goals: Good    Frequency Min 3X/week   Barriers to discharge Other (comment) home with spouse, who also had COVID recently    Co-evaluation               AM-PAC PT "6 Clicks" Mobility  Outcome Measure Help needed turning from your back to your side while in a flat bed without using bedrails?: A Little Help needed moving from lying on your back to sitting on the side of a flat bed without using bedrails?: A Little Help needed moving to and from a bed to a chair (including a wheelchair)?: A Little Help needed standing up from a chair using your arms (e.g., wheelchair or bedside chair)?: A Little Help needed to walk in hospital room?: A Little Help needed climbing 3-5 steps with a railing? : A Lot 6 Click Score: 17    End of Session Equipment Utilized During Treatment: Oxygen Activity Tolerance: Patient limited by lethargy;Patient limited by fatigue;Treatment limited secondary to medical complications (Comment) Patient left: in chair;with call bell/phone within  reach Nurse Communication: Mobility status PT Visit Diagnosis: Unsteadiness on feet (R26.81);Muscle weakness (generalized) (M62.81)    Time: 4235-3614 PT Time Calculation (min) (ACUTE ONLY): 51 min   Charges:   PT Evaluation $PT Eval Moderate Complexity: 1 Mod PT Treatments $Gait Training: 8-22 mins $Therapeutic Activity: 8-22 mins        Drema Pry, PT   Freddi Starr 06/23/2019, 4:13 PM

## 2019-06-23 NOTE — Progress Notes (Addendum)
PROGRESS NOTE    Nicholas Marks  HWE:[id number] DOB: 08-29-1955 DOA: 06/21/2019 PCP: Patient, No Pcp Per   Brief Narrative:  Nicholas Marks is Nicholas Marks 63 y.o. male who denies any significant past medical history now presents emergency department for evaluation of progressive shortness of breath and low oxygen saturations at home.  Patient reports that he developed upper respiratory symptoms, fevers, headache, and general malaise little over [place] week ago, was found to be positive for COVID-19 (outpatient test from 06/13/2019 is posted in media tab in electronic medical record), started an antibiotic and steroid, has been taking acetaminophen at home, reports resolution of fevers and headache over the past couple days, but has had progressive dyspnea.  Patient has home pulse oximeter and noted that his saturation was dipping into the mid to upper 70s with minimal exertion today.  He has had Nicholas Bartolini cough that is productive of thick dark sputum, denies chest pain or leg swelling, denies leg tenderness, and denies any abdominal pain.  ED Course: Upon arrival to the ED, patient is found to be afebrile, saturating 78% on room air, tachypneic to 30, and with stable blood pressure.  EKG features Nicholas Scotti sinus rhythm and chest x-ray is concerning for focal infiltrate in the left lower lobe.  Chemistry panel is notable for slight hyponatremia and mild elevation in transaminases.  CBC features Rhylie Stehr leukocytosis to 13,500.  Lactic acid is normal.  D-dimer is elevated to 2.38.  Blood cultures were collected in the emergency department and the patient was treated with Decadron and supplemental oxygen.  Assessment & Plan:   Principal Problem:   Pneumonia due to COVID-19 virus  1. Pneumonia; COVID-19; acute hypoxic respiratory failure  - Seen this AM on 2-4 L (on 4 L at time of my interview) comfortable satting in low 90's - CXR on 12/1 concerning for focal infiltrate in LLL -> started on abx for possible superimposed bacterial  pneumonia - CXR 12/2 with bilateral airspace disease with progression on L  - CXR 12/3 stable - Continue steroids, remdesivir - s/p plasma 12/2  - continue abx at this point, procalcitonin trending down - deescalate/dc as able - will hold off on actemra at this time given concern for possible bacterial pneumonia at presentation, though would consider this if he were to worsen.  Pt was notably taking steroids and abx prior to admission. - I/O, daily weights - redose with lasix today - prone as able, OOB as able, IS - Daily inflammatory labs - pending for today  COVID-19 Labs  Recent Labs    06/21/19 1721 06/22/19 0805 06/23/19 0018  DDIMER 2.38* 1.74* 1.78*  FERRITIN 1,867* 1,397* 1,222*  LDH 417*  --   --   CRP 17.6* 16.5* 11.8*    No results found for: SARSCOV2NAA  2. Elevated d-dimer  - D-dimer is elevated to 2.38 in ED  - There is no chest pain or hemoptysis, and no evidence for DVT, and this is likely secondary to the acute infectious illness  - Continue to treat pneumonia and trend   Hyperglycemia: follow A1c (7.1).  Start basal and bolus regimen.  SSI.   DVT prophylaxis: lovenox Code Status: full  Family Communication: wife over phone 12/3 Disposition Plan: pending further improvement  Consultants:   none  Procedures:   none  Antimicrobials:  Anti-infectives (From admission, onward)   Start     Dose/Rate Route Frequency Ordered Stop   06/22/19 1000  remdesivir 100 mg in sodium chloride 0.9 %  250 mL IVPB     100 mg 500 mL/hr over 30 Minutes Intravenous Every 24 hours 06/21/19 1956 06/26/19 0959   06/21/19 2100  cefTRIAXone (ROCEPHIN) 2 g in sodium chloride 0.9 % 100 mL IVPB     2 g 200 mL/hr over 30 Minutes Intravenous Every 24 hours 06/21/19 2047 06/26/19 2059   06/21/19 2100  azithromycin (ZITHROMAX) 500 mg in sodium chloride 0.9 % 250 mL IVPB     500 mg 250 mL/hr over 60 Minutes Intravenous Every 24 hours 06/21/19 2047 06/26/19 2059   06/21/19 2100   remdesivir 200 mg in sodium chloride 0.9 % 250 mL IVPB     200 mg 500 mL/hr over 30 Minutes Intravenous Once 06/21/19 1956 06/21/19 2211     Subjective: Feels better than yesterday Still SOB with exertion  Objective: Vitals:   06/23/19 0015 06/23/19 0410 06/23/19 0823 06/23/19 1146  BP: (!) 143/87 (!) 146/78  (!) 152/82  Pulse: 85 89    Resp: 20 (!) 22    Temp: 97.9 F (36.6 C) 97.9 F (36.6 C) 97.8 F (36.6 C)   TempSrc: Oral  Axillary   SpO2: 92% 90%    Weight:      Height:        Intake/Output Summary (Last 24 hours) at 06/23/2019 1451 Last data filed at 06/23/2019 1200 Gross per 24 hour  Intake 1210.01 ml  Output 4585 ml  Net -3374.99 ml   Filed Weights   06/21/19 1600 06/22/19 0147  Weight: 117.9 kg 117.9 kg    Examination:  General: No acute distress. Cardiovascular: RRR Lungs: unlabored, crackles at bases Abdomen: Soft, nontender, nondistended  Neurological: Alert and oriented 3. Moves all extremities 4. Cranial nerves II through XII grossly intact. Skin: Warm and dry. No rashes or lesions. Extremities: No clubbing or cyanosis. No edema.   Data Reviewed: I have personally reviewed following labs and imaging studies  CBC: Recent Labs  Lab 06/21/19 1721 06/22/19 0805 06/23/19 0018  WBC 13.5* 9.8 14.3*  NEUTROABS 11.1* 8.1* 12.0*  HGB 16.2 15.7 15.2  HCT 48.4 46.6 46.0  MCV 90.8 91.0 92.2  PLT 277 267 280   Basic Metabolic Panel: Recent Labs  Lab 06/21/19 1721 06/22/19 0805 06/23/19 0018  NA 134* 138 138  K 3.6 4.5 4.4  CL 97* 103 101  CO2 24 22 20*  GLUCOSE 131* 172* 230*  BUN 17 16 24*  CREATININE 0.92 0.84 0.88  CALCIUM 9.2 8.4* 8.8*  MG  --  2.0 2.2  PHOS  --  3.1 2.8   GFR: Estimated Creatinine Clearance: 106.6 mL/min (by C-G formula based on SCr of 0.88 mg/dL). Liver Function Tests: Recent Labs  Lab 06/21/19 1721 06/22/19 0805 06/23/19 0018  AST 48* 50* 43*  ALT 55* 63* 76*  ALKPHOS 59 60 61  BILITOT 1.0 0.6 0.4   PROT 7.4 6.8 6.9  ALBUMIN 3.1* 3.0* 3.0*   No results for input(s): LIPASE, AMYLASE in the last 168 hours. No results for input(s): AMMONIA in the last 168 hours. Coagulation Profile: No results for input(s): INR, PROTIME in the last 168 hours. Cardiac Enzymes: No results for input(s): CKTOTAL, CKMB, CKMBINDEX, TROPONINI in the last 168 hours. BNP (last 3 results) No results for input(s): PROBNP in the last 8760 hours. HbA1C: Recent Labs    06/22/19 0805  HGBA1C 7.1*   CBG: Recent Labs  Lab 06/22/19 1735 06/22/19 2128 06/23/19 0725 06/23/19 1145  GLUCAP 228* 240* 252* 368*  Lipid Profile: Recent Labs    06/21/19 1721  TRIG 145   Thyroid Function Tests: No results for input(s): TSH, T4TOTAL, FREET4, T3FREE, THYROIDAB in the last 72 hours. Anemia Panel: Recent Labs    06/22/19 0805 06/23/19 0018  FERRITIN 1,397* 1,222*   Sepsis Labs: Recent Labs  Lab 06/21/19 1721 06/22/19 1130 06/23/19 0018  PROCALCITON 0.32 0.20 0.18  LATICACIDVEN 1.8  --   --     Recent Results (from the past 240 hour(s))  Blood Culture (routine x 2)     Status: None (Preliminary result)   Collection Time: 06/21/19  5:21 PM   Specimen: BLOOD LEFT FOREARM  Result Value Ref Range Status   Specimen Description   Final    BLOOD LEFT FOREARM Performed at [facility], [address], [place], [place] [address]    Special Requests   Final    BOTTLES DRAWN AEROBIC AND ANAEROBIC Blood Culture adequate volume Performed at [facility], [address], [place], [place] [address]    Culture   Final    NO GROWTH 2 DAYS Performed at [facility], [address], [place], [place] [address]    Report Status PENDING  Incomplete  Blood Culture (routine x 2)     Status: None (Preliminary result)   Collection Time: 06/21/19  5:21 PM   Specimen: BLOOD  Result Value Ref Range Status   Specimen Description   Final    BLOOD BLOOD LEFT HAND Performed at  [facility], [address] [place], [place] [address]    Special Requests   Final    BOTTLES DRAWN AEROBIC AND ANAEROBIC Blood Culture adequate volume Performed at [facility], [address] [place], [place] [address]    Culture   Final    NO GROWTH 2 DAYS Performed at [facility], [address] [place], [place] [address]    Report Status PENDING  Incomplete         Radiology Studies: Dg Chest Port 1 View  Result Date: 06/23/2019 CLINICAL DATA:  Short of breath. EXAM: PORTABLE CHEST 1 VIEW COMPARISON:  06/22/2019 FINDINGS: Cardiac enlargement. No pleural effusion identified. Left midlung and left base airspace disease is similar to previous exam. No new findings. IMPRESSION: 1. No change in aeration to the left lung compared with previous exam. 2. Stable cardiac enlargement. Electronically Signed   By: Signa Kell M.D.   On: 06/23/2019 09:25   Dg Chest Port 1 View  Result Date: 06/22/2019 CLINICAL DATA:  Hypoxia.  COVID-19 positive EXAM: PORTABLE CHEST 1 VIEW COMPARISON:  12 and 2020 FINDINGS: Progression of left mid lung airspace disease. Left lower lobe airspace disease remains. Probable mild right lower lobe airspace disease. No effusion. Cardiac enlargement. IMPRESSION: Bilateral airspace disease with progression on the left. Electronically Signed   By: Marlan Palau M.D.   On: 06/22/2019 09:19   Dg Chest Port 1 View  Result Date: 06/21/2019 CLINICAL DATA:  COVID-19. Hypoxia. EXAM: PORTABLE CHEST 1 VIEW COMPARISON:  None. FINDINGS: The heart size and pulmonary vascularity are normal. There is Emile Ringgenberg hazy infiltrate at the left lung base seen through the left side of the heart. Slight peripheral haziness at the lung bases is most likely Tiny Chaudhary overlying soft tissue. IMPRESSION: Focal infiltrate in the left lower lobe posterior medially. Electronically Signed   By: Francene Boyers M.D.   On: 06/21/2019 18:53        Scheduled  Meds: .  enoxaparin (LOVENOX) injection  60 mg Subcutaneous Q24H  . famotidine  20 mg Oral BID  . furosemide  40 mg Intravenous Q6H  . insulin aspart  0-20 Units Subcutaneous Q4H  . insulin aspart  3 Units Subcutaneous TID WC  . insulin detemir  0.075 Units/kg Subcutaneous BID  . methylPREDNISolone (SOLU-MEDROL) injection  60 mg Intravenous Q12H   Continuous Infusions: . azithromycin 500 mg (06/22/19 2220)  . cefTRIAXone (ROCEPHIN)  IV 2 g (06/22/19 2122)  . remdesivir 100 mg in NS 250 mL 100 mg (06/23/19 1003)     LOS: 2 days    Time spent: over 7 min    Fayrene Helper, MD [facility] Pager AMION  If 7PM-7AM, please contact night-coverage [email or web address] Password [email or web address] 06/23/2019, 2:51 PM

## 2019-06-24 ENCOUNTER — Inpatient Hospital Stay (HOSPITAL_COMMUNITY): Payer: BC Managed Care – PPO

## 2019-06-24 LAB — CBC WITH DIFFERENTIAL/PLATELET
Abs Immature Granulocytes: 0.57 10*3/uL — ABNORMAL HIGH (ref 0.00–0.07)
Basophils Absolute: 0.1 10*3/uL (ref 0.0–0.1)
Basophils Relative: 1 %
Eosinophils Absolute: 0 10*3/uL (ref 0.0–0.5)
Eosinophils Relative: 0 %
HCT: 46.8 % (ref 39.0–52.0)
Hemoglobin: 15.5 g/dL (ref 13.0–17.0)
Immature Granulocytes: 3 %
Lymphocytes Relative: 5 %
Lymphs Abs: 1.1 10*3/uL (ref 0.7–4.0)
MCH: 30.1 pg (ref 26.0–34.0)
MCHC: 33.1 g/dL (ref 30.0–36.0)
MCV: 90.9 fL (ref 80.0–100.0)
Monocytes Absolute: 1 10*3/uL (ref 0.1–1.0)
Monocytes Relative: 5 %
Neutro Abs: 17.7 10*3/uL — ABNORMAL HIGH (ref 1.7–7.7)
Neutrophils Relative %: 86 %
Platelets: 368 10*3/uL (ref 150–400)
RBC: 5.15 MIL/uL (ref 4.22–5.81)
RDW: 12.4 % (ref 11.5–15.5)
WBC: 20.3 10*3/uL — ABNORMAL HIGH (ref 4.0–10.5)
nRBC: 0 % (ref 0.0–0.2)

## 2019-06-24 LAB — BASIC METABOLIC PANEL
Anion gap: 17 — ABNORMAL HIGH (ref 5–15)
BUN: 36 mg/dL — ABNORMAL HIGH (ref 8–23)
CO2: 22 mmol/L (ref 22–32)
Calcium: 9.3 mg/dL (ref 8.9–10.3)
Chloride: 97 mmol/L — ABNORMAL LOW (ref 98–111)
Creatinine, Ser: 1.13 mg/dL (ref 0.61–1.24)
GFR calc Af Amer: >60 mL/min (ref >60)
GFR calc non Af Amer: >60 mL/min (ref >60)
Glucose, Bld: 437 mg/dL — ABNORMAL HIGH (ref 70–99)
Potassium: 4.7 mmol/L (ref 3.5–5.1)
Sodium: 136 mmol/L (ref 135–145)

## 2019-06-24 LAB — C-REACTIVE PROTEIN: CRP: 4.9 mg/dL — ABNORMAL HIGH (ref <1.0)

## 2019-06-24 LAB — COMPREHENSIVE METABOLIC PANEL
ALT: 77 U/L — ABNORMAL HIGH (ref 0–44)
AST: 30 U/L (ref 15–41)
Albumin: 2.9 g/dL — ABNORMAL LOW (ref 3.5–5.0)
Alkaline Phosphatase: 65 U/L (ref 38–126)
Anion gap: 13 (ref 5–15)
BUN: 29 mg/dL — ABNORMAL HIGH (ref 8–23)
CO2: 23 mmol/L (ref 22–32)
Calcium: 8.7 mg/dL — ABNORMAL LOW (ref 8.9–10.3)
Chloride: 102 mmol/L (ref 98–111)
Creatinine, Ser: 0.92 mg/dL (ref 0.61–1.24)
GFR calc Af Amer: >60 mL/min (ref >60)
GFR calc non Af Amer: >60 mL/min (ref >60)
Glucose, Bld: 222 mg/dL — ABNORMAL HIGH (ref 70–99)
Potassium: 4.3 mmol/L (ref 3.5–5.1)
Sodium: 138 mmol/L (ref 135–145)
Total Bilirubin: 0.5 mg/dL (ref 0.3–1.2)
Total Protein: 6.8 g/dL (ref 6.5–8.1)

## 2019-06-24 LAB — GLUCOSE, CAPILLARY
Glucose-Capillary: 161 mg/dL — ABNORMAL HIGH (ref 70–99)
Glucose-Capillary: 171 mg/dL — ABNORMAL HIGH (ref 70–99)
Glucose-Capillary: 271 mg/dL — ABNORMAL HIGH (ref 70–99)
Glucose-Capillary: 311 mg/dL — ABNORMAL HIGH (ref 70–99)
Glucose-Capillary: 393 mg/dL — ABNORMAL HIGH (ref 70–99)
Glucose-Capillary: 413 mg/dL — ABNORMAL HIGH (ref 70–99)
Glucose-Capillary: 415 mg/dL — ABNORMAL HIGH (ref 70–99)

## 2019-06-24 LAB — FERRITIN: Ferritin: 1238 ng/mL — ABNORMAL HIGH (ref 24–336)

## 2019-06-24 LAB — MAGNESIUM: Magnesium: 2.1 mg/dL (ref 1.7–2.4)

## 2019-06-24 LAB — PHOSPHORUS: Phosphorus: 3.4 mg/dL (ref 2.5–4.6)

## 2019-06-24 LAB — D-DIMER, QUANTITATIVE: D-Dimer, Quant: 1.13 ug/mL-FEU — ABNORMAL HIGH (ref 0.00–0.50)

## 2019-06-24 LAB — PROCALCITONIN: Procalcitonin: 0.14 ng/mL

## 2019-06-24 MED ORDER — FUROSEMIDE 10 MG/ML IJ SOLN
40.0000 mg | Freq: Four times a day (QID) | INTRAMUSCULAR | Status: AC
  Administered 2019-06-24: 40 mg via INTRAVENOUS
  Filled 2019-06-24: qty 4

## 2019-06-24 MED ORDER — SODIUM CHLORIDE 0.9 % IV SOLN
100.0000 mg | Freq: Every day | INTRAVENOUS | Status: AC
  Administered 2019-06-25: 100 mg via INTRAVENOUS
  Filled 2019-06-24 (×2): qty 20

## 2019-06-24 MED ORDER — INSULIN ASPART 100 UNIT/ML ~~LOC~~ SOLN
6.0000 [IU] | Freq: Three times a day (TID) | SUBCUTANEOUS | Status: DC
  Administered 2019-06-24 – 2019-06-25 (×3): 6 [IU] via SUBCUTANEOUS

## 2019-06-24 MED ORDER — SALINE SPRAY 0.65 % NA SOLN
1.0000 | NASAL | Status: DC | PRN
  Filled 2019-06-24: qty 44

## 2019-06-24 MED ORDER — FUROSEMIDE 10 MG/ML IJ SOLN
40.0000 mg | Freq: Four times a day (QID) | INTRAMUSCULAR | Status: DC
  Administered 2019-06-24: 40 mg via INTRAVENOUS
  Filled 2019-06-24: qty 4

## 2019-06-24 MED ORDER — INSULIN ASPART 100 UNIT/ML ~~LOC~~ SOLN
10.0000 [IU] | Freq: Once | SUBCUTANEOUS | Status: AC
  Administered 2019-06-24: 10 [IU] via SUBCUTANEOUS

## 2019-06-24 NOTE — Progress Notes (Signed)
Occupational Therapy Evaluation Patient Details Name: Nicholas Marks MRN: [id number] DOB: 1956-05-09 Today's Date: 06/24/2019    History of Present Illness 63 y.o. male who denies any sig PMHx, presented to ED w/  progressive SOB and hypoxia at home. He reports upper respiratory sx, fevers, headache, and general malaise for over  1 week, was found to beCOVID-19 + (outpatient test) 06/13/2019. was started on antibiotic and steroid w/ resolution of fevers and headache, but c/o progressive dyspnea and contiued hypoxia at home. Pt admitted w/ PNA sec to COVID.   Clinical Impression   PTA, pt independent and worked full time. Pt overall modified independent with mobility and able to complete ADL with set up. Pt ambulated to bathroom on 4L to complete ADL with brief desat to 88. Pt wanting to be able to ambulate to the bathroom on his own. Pt able to ambulate to the bathroom with 2 extensions on 5L with brief dest to 86 and 1/4 DOE. Notified nurse to encourage pt to ambulate to bathroom. Pt ambulated around 80 ft in room after ADL with desat to 86 on 4L with quick rebound to 90s. Educated pt on pursed lip breathing and correct use of flutter valve and incentive spirometer. Pt expressing anxiety about having the corona virus and his fear of dying. Listened to concerns and provided comfort and reassurance. Pt educated on HEP - Pt asking therapist to stay to go over exercises so he could work on it over the weekend. Pt very appreciative. Will follow acutely. Do not anticipate the need for OT follow up.     Follow Up Recommendations  No OT follow up;Supervision - Intermittent    Equipment Recommendations  None recommended by OT    Recommendations for Other Services       Precautions / Restrictions Precautions Precautions: Fall Precaution Comments: desats w/ activity Restrictions Weight Bearing Restrictions: No      Mobility Bed Mobility Overal bed mobility: Independent                 Transfers Overall transfer level: Modified independent                    Balance Overall balance assessment: No apparent balance deficits (not formally assessed)                                         ADL either performed or assessed with clinical judgement   ADL Overall ADL's : Needs assistance/impaired                                     Functional mobility during ADLs: Modified independent General ADL Comments: overall setup for ADL. Able to ambulate to bathroom x 2. Initially on 4 L wiht SpO2 dropping to 88, then on 5L with 2 extensions with SpO2 desat to 86 with quick rebound to 90s. Began educaitonon energy conservation strategies. Pt asking to shampoo his hair  - used shampoo cap.      Vision Baseline Vision/History: Wears glasses       Perception     Praxis      Pertinent Vitals/Pain Pain Assessment: No/denies pain     Hand Dominance Right   Extremity/Trunk Assessment Upper Extremity Assessment Upper Extremity Assessment: Overall WFL for tasks assessed   Lower Extremity Assessment  Lower Extremity Assessment: Defer to PT evaluation   Cervical / Trunk Assessment Cervical / Trunk Assessment: Normal   Communication Communication Communication: No difficulties   Cognition Arousal/Alertness: Awake/alert Behavior During Therapy: WFL for tasks assessed/performed Overall Cognitive Status: Within Functional Limits for tasks assessed                                     General Comments  Educated on improtance of proning    Exercises Exercises: Other exercises Other Exercises Other Exercises: incentive spirometer x 10 able to pull w/ cues Other Exercises: flutter valve x 10  Other Exercises: standing HEP exercise sheet issued Other Exercises: theraband issued. Need to issue handout   Shoulder Instructions      Home Living Family/patient expects to be discharged to:: Private  residence Living Arrangements: Spouse/significant other Available Help at Discharge: Family Type of Home: House Home Access: Stairs to enter [employer] of Steps: 4 from garage Entrance Stairs-Rails: Left Home Layout: Two level;Able to live on main level with bedroom/bathroom Alternate Level Stairs-Number of Steps: 13 x 2   Bathroom Shower/Tub: Walk-in shower;Tub/shower unit   Bathroom Toilet: Handicapped height Bathroom Accessibility: Yes   Home Equipment: Walker - 2 wheels;Cane - single point;Shower seat(sleep number bed, has access to a 3in1)          Prior Functioning/Environment Level of Independence: Independent                 OT Problem List: Decreased activity tolerance;Cardiopulmonary status limiting activity;Obesity      OT Treatment/Interventions: Self-care/ADL training;Therapeutic exercise;Neuromuscular education;Energy conservation;DME and/or AE instruction;Therapeutic activities;Patient/family education    OT Goals(Current goals can be found in the care plan section) Acute Rehab OT Goals Patient Stated Goal: to get better and be OK OT Goal Formulation: With patient Time For Goal Achievement: 07/08/19 Potential to Achieve Goals: Good  OT Frequency: Min 3X/week   Barriers to D/C:            Co-evaluation              AM-PAC OT "6 Clicks" Daily Activity     Outcome Measure Help from another person eating meals?: None Help from another person taking care of personal grooming?: None Help from another person toileting, which includes using toliet, bedpan, or urinal?: None Help from another person bathing (including washing, rinsing, drying)?: A Little Help from another person to put on and taking off regular upper body clothing?: None Help from another person to put on and taking off regular lower body clothing?: A Little 6 Click Score: 22   End of Session Equipment Utilized During Treatment: Oxygen(4L) Nurse Communication: Mobility  status(Pt safe to ambulate independently to bathroom)  Activity Tolerance: Patient tolerated treatment well Patient left: in chair;with call bell/phone within reach  OT Visit Diagnosis: Muscle weakness (generalized) (M62.81)                Time: 1333-1430 OT Time Calculation (min): 57 min Charges:  OT General Charges $OT Visit: 1 Visit OT Evaluation $OT Eval Moderate Complexity: 1 Mod OT Treatments $Self Care/Home Management : 23-37 mins $Therapeutic Exercise: 8-22 mins  Luisa Dago, OT/L   Acute OT Clinical Specialist Acute Rehabilitation Services Pager [phone] Office [phone]   [facility] 06/24/2019, 5:55 PM

## 2019-06-24 NOTE — Progress Notes (Addendum)
PROGRESS NOTE    YOSHIO SELIGA  CXK:[id number] DOB: 05/04/1956 DOA: 06/21/2019 PCP: Patient, No Pcp Per   Brief Narrative:  ARCANGEL MINION is a 63 y.o. male who denies any significant past medical history now presents emergency department for evaluation of progressive shortness of breath and low oxygen saturations at home.  Patient reports that he developed upper respiratory symptoms, fevers, headache, and general malaise little over a week ago, was found to be positive for COVID-19 (outpatient test from 06/13/2019 is posted in media tab in electronic medical record), started an antibiotic and steroid, has been taking acetaminophen at home, reports resolution of fevers and headache over the past couple days, but has had progressive dyspnea.  Patient has home pulse oximeter and noted that his saturation was dipping into the mid to upper 70s with minimal exertion today.  He has had a cough that is productive of thick dark sputum, denies chest pain or leg swelling, denies leg tenderness, and denies any abdominal pain.  ED Course: Upon arrival to the ED, patient is found to be afebrile, saturating 78% on room air, tachypneic to 30, and with stable blood pressure.  EKG features a sinus rhythm and chest x-ray is concerning for focal infiltrate in the left lower lobe.  Chemistry panel is notable for slight hyponatremia and mild elevation in transaminases.  CBC features a leukocytosis to 13,500.  Lactic acid is normal.  D-dimer is elevated to 2.38.  Blood cultures were collected in the emergency department and the patient was treated with Decadron and supplemental oxygen.  Assessment & Plan:   Principal Problem:   Pneumonia due to COVID-19 virus  1. Pneumonia; COVID-19; acute hypoxic respiratory failure  - Seen this AM on 2-4 L (on 4 L at time of my interview) comfortable satting in low 90's - CXR on 12/1 concerning for focal infiltrate in LLL -> started on abx for possible superimposed bacterial  pneumonia - CXR 12/2 with bilateral airspace disease with progression on L  - CXR 12/3 stable - Continue steroids, remdesivir - s/p plasma 12/2  - continue abx at this point, procalcitonin trending down - will complete 5 day course. - will hold off on actemra at this time given concern for possible bacterial pneumonia at presentation, though would consider this if he were to worsen.  Pt was notably taking steroids and abx prior to admission. - I/O, daily weights - continue lasix as tolerated - prone as able, OOB as able, IS - Daily inflammatory labs - overall improving  COVID-19 Labs  Recent Labs    06/21/19 1721 06/22/19 0805 06/23/19 0018 06/24/19 0218  DDIMER 2.38* 1.74* 1.78* 1.13*  FERRITIN 1,867* 1,397* 1,222* 1,238*  LDH 417*  --   --   --   CRP 17.6* 16.5* 11.8* 4.9*    No results found for: SARSCOV2NAA  2. Elevated d-dimer  L>R LE Edema - D-dimer is elevated to 2.38 in ED  - There is no chest pain or hemoptysis, and no evidence for DVT, and this is likely secondary to the acute infectious illness  - Continue to treat pneumonia and trend  - follow LE [place]  Hyperglycemia: follow A1c (7.1).  Start basal and bolus regimen.  SSI.   DVT prophylaxis: lovenox Code Status: full  Family Communication: wife over phone 12/4 Disposition Plan: pending further improvement  Consultants:   none  Procedures:   none  Antimicrobials:  Anti-infectives (From admission, onward)   Start     Dose/Rate Route  Frequency Ordered Stop   06/22/19 1000  remdesivir 100 mg in sodium chloride 0.9 % 250 mL IVPB     100 mg 500 mL/hr over 30 Minutes Intravenous Every 24 hours 06/21/19 1956 06/26/19 0959   06/21/19 2100  cefTRIAXone (ROCEPHIN) 2 g in sodium chloride 0.9 % 100 mL IVPB     2 g 200 mL/hr over 30 Minutes Intravenous Every 24 hours 06/21/19 2047 06/26/19 2059   06/21/19 2100  azithromycin (ZITHROMAX) 500 mg in sodium chloride 0.9 % 250 mL IVPB     500 mg 250 mL/hr over 60  Minutes Intravenous Every 24 hours 06/21/19 2047 06/26/19 2059   06/21/19 2100  remdesivir 200 mg in sodium chloride 0.9 % 250 mL IVPB     200 mg 500 mL/hr over 30 Minutes Intravenous Once 06/21/19 1956 06/21/19 2211     Subjective: Stable SOB No new complaints  Objective: Vitals:   06/23/19 2359 06/24/19 0453 06/24/19 0815 06/24/19 1201  BP: (!) 141/89 136/76 134/77 138/90  Pulse:  81 84 86  Resp:  (!) 23 (!) 21 19  Temp: 98.4 F (36.9 C) 98 F (36.7 C) 98.4 F (36.9 C) 98.1 F (36.7 C)  TempSrc: Axillary Oral Oral Oral  SpO2:  (!) 89% 90% 92%  Weight:      Height:        Intake/Output Summary (Last 24 hours) at 06/24/2019 1343 Last data filed at 06/24/2019 1300 Gross per 24 hour  Intake 1250 ml  Output 3975 ml  Net -2725 ml   Filed Weights   06/21/19 1600 06/22/19 0147  Weight: 117.9 kg 117.9 kg    Examination:  General: No acute distress. Cardiovascular: RRR Lungs: Clear to auscultation bilaterally with good air movement.  Abdomen: Soft, nontender, nondistended Neurological: Alert and oriented 3. Moves all extremities 4. Cranial nerves II through XII grossly intact. Skin: Warm and dry. No rashes or lesions. Extremities: No clubbing or cyanosis. LLE>RLE trace edema   Data Reviewed: I have personally reviewed following labs and imaging studies  CBC: Recent Labs  Lab 06/21/19 1721 06/22/19 0805 06/23/19 0018 06/24/19 0218  WBC 13.5* 9.8 14.3* 20.3*  NEUTROABS 11.1* 8.1* 12.0* 17.7*  HGB 16.2 15.7 15.2 15.5  HCT 48.4 46.6 46.0 46.8  MCV 90.8 91.0 92.2 90.9  PLT 277 267 280 563   Basic Metabolic Panel: Recent Labs  Lab 06/21/19 1721 06/22/19 0805 06/23/19 0018 06/24/19 0218  NA 134* 138 138 138  K 3.6 4.5 4.4 4.3  CL 97* 103 101 102  CO2 24 22 20* 23  GLUCOSE 131* 172* 230* 222*  BUN 17 16 24* 29*  CREATININE 0.92 0.84 0.88 0.92  CALCIUM 9.2 8.4* 8.8* 8.7*  MG  --  2.0 2.2 2.1  PHOS  --  3.1 2.8 3.4   GFR: Estimated Creatinine  Clearance: 102 mL/min (by C-G formula based on SCr of 0.92 mg/dL). Liver Function Tests: Recent Labs  Lab 06/21/19 1721 06/22/19 0805 06/23/19 0018 06/24/19 0218  AST 48* 50* 43* 30  ALT 55* 63* 76* 77*  ALKPHOS 59 60 61 65  BILITOT 1.0 0.6 0.4 0.5  PROT 7.4 6.8 6.9 6.8  ALBUMIN 3.1* 3.0* 3.0* 2.9*   No results for input(s): LIPASE, AMYLASE in the last 168 hours. No results for input(s): AMMONIA in the last 168 hours. Coagulation Profile: No results for input(s): INR, PROTIME in the last 168 hours. Cardiac Enzymes: No results for input(s): CKTOTAL, CKMB, CKMBINDEX, TROPONINI in the last 168  hours. BNP (last 3 results) No results for input(s): PROBNP in the last 8760 hours. HbA1C: Recent Labs    06/22/19 0805  HGBA1C 7.1*   CBG: Recent Labs  Lab 06/23/19 2050 06/24/19 0001 06/24/19 0353 06/24/19 0814 06/24/19 1153  GLUCAP 393* 311* 171* 161* 271*   Lipid Profile: Recent Labs    06/21/19 1721  TRIG 145   Thyroid Function Tests: No results for input(s): TSH, T4TOTAL, FREET4, T3FREE, THYROIDAB in the last 72 hours. Anemia Panel: Recent Labs    06/23/19 0018 06/24/19 0218  FERRITIN 1,222* 1,238*   Sepsis Labs: Recent Labs  Lab 06/21/19 1721 06/22/19 1130 06/23/19 0018 06/24/19 0218  PROCALCITON 0.32 0.20 0.18 0.14  LATICACIDVEN 1.8  --   --   --     Recent Results (from the past 240 hour(s))  Blood Culture (routine x 2)     Status: None (Preliminary result)   Collection Time: 06/21/19  5:21 PM   Specimen: BLOOD LEFT FOREARM  Result Value Ref Range Status   Specimen Description   Final    BLOOD LEFT FOREARM Performed at [facility], [address] [place], [place] [address]    Special Requests   Final    BOTTLES DRAWN AEROBIC AND ANAEROBIC Blood Culture adequate volume Performed at [facility], [address], [place], [place] [address]    Culture   Final    NO GROWTH 3 DAYS Performed at [facility], [address], [place], [place] [address]    Report Status PENDING  Incomplete  Blood Culture (routine x 2)     Status: None (Preliminary result)   Collection Time: 06/21/19  5:21 PM   Specimen: BLOOD  Result Value Ref Range Status   Specimen Description   Final    BLOOD BLOOD LEFT HAND Performed at [facility], [address], [place], [place] [address]    Special Requests   Final    BOTTLES DRAWN AEROBIC AND ANAEROBIC Blood Culture adequate volume Performed at [facility], [address] [place], [place] [address]    Culture   Final    NO GROWTH 3 DAYS Performed at [facility], [address], [place], [place] [address]    Report Status PENDING  Incomplete         Radiology Studies: Dg Chest Port 1 View  Result Date: 06/24/2019 CLINICAL DATA:  Hypoxia. EXAM: PORTABLE CHEST 1 VIEW COMPARISON:  06/23/2019 FINDINGS: Lungs are adequately inflated with stable bilateral patchy hazy airspace opacification likely infection. No effusion. Mild stable cardiomegaly. Remainder of the exam is unchanged. IMPRESSION: Stable hazy patchy bilateral airspace process likely infection. Electronically Signed   By: Elberta Fortis M.D.   On: 06/24/2019 08:55   Dg Chest Port 1 View  Result Date: 06/23/2019 CLINICAL DATA:  Short of breath. EXAM: PORTABLE CHEST 1 VIEW COMPARISON:  06/22/2019 FINDINGS: Cardiac enlargement. No pleural effusion identified. Left midlung and left base airspace disease is similar to previous exam. No new findings. IMPRESSION: 1. No change in aeration to the left lung compared with previous exam. 2. Stable cardiac enlargement. Electronically Signed   By: Signa Kell M.D.   On: 06/23/2019 09:25        Scheduled Meds: . enoxaparin (LOVENOX) injection  60 mg Subcutaneous Q24H  . famotidine  20 mg Oral BID  . furosemide  40 mg Intravenous Q6H  . insulin aspart  0-20 Units Subcutaneous Q4H  .  insulin detemir  9 Units  Subcutaneous BID  . mouth rinse  15 mL Mouth Rinse BID  . methylPREDNISolone (SOLU-MEDROL) injection  60 mg Intravenous Q12H   Continuous Infusions: . azithromycin 500 mg (06/23/19 2220)  . cefTRIAXone (ROCEPHIN)  IV 2 g (06/23/19 2104)  . remdesivir 100 mg in NS 100 mL 100 mg (06/24/19 1030)     LOS: 3 days    Time spent: over 30 min    Lacretia Nicksaldwell Powell, MD [facility] Pager AMION  If 7PM-7AM, please contact night-coverage [email or web address] Password [email or web address] 06/24/2019, 1:43 PM

## 2019-06-25 ENCOUNTER — Inpatient Hospital Stay (HOSPITAL_COMMUNITY): Payer: BC Managed Care – PPO

## 2019-06-25 DIAGNOSIS — R7989 Other specified abnormal findings of blood chemistry: Secondary | ICD-10-CM

## 2019-06-25 LAB — MAGNESIUM: Magnesium: 2.2 mg/dL (ref 1.7–2.4)

## 2019-06-25 LAB — COMPREHENSIVE METABOLIC PANEL
ALT: 97 U/L — ABNORMAL HIGH (ref 0–44)
AST: 31 U/L (ref 15–41)
Albumin: 3.1 g/dL — ABNORMAL LOW (ref 3.5–5.0)
Alkaline Phosphatase: 68 U/L (ref 38–126)
Anion gap: 15 (ref 5–15)
BUN: 36 mg/dL — ABNORMAL HIGH (ref 8–23)
CO2: 24 mmol/L (ref 22–32)
Calcium: 8.9 mg/dL (ref 8.9–10.3)
Chloride: 99 mmol/L (ref 98–111)
Creatinine, Ser: 0.97 mg/dL (ref 0.61–1.24)
GFR calc Af Amer: >60 mL/min (ref >60)
GFR calc non Af Amer: >60 mL/min (ref >60)
Glucose, Bld: 286 mg/dL — ABNORMAL HIGH (ref 70–99)
Potassium: 4.3 mmol/L (ref 3.5–5.1)
Sodium: 138 mmol/L (ref 135–145)
Total Bilirubin: 0.5 mg/dL (ref 0.3–1.2)
Total Protein: 7 g/dL (ref 6.5–8.1)

## 2019-06-25 LAB — CBC WITH DIFFERENTIAL/PLATELET
Abs Immature Granulocytes: 0.49 10*3/uL — ABNORMAL HIGH (ref 0.00–0.07)
Basophils Absolute: 0.1 10*3/uL (ref 0.0–0.1)
Basophils Relative: 0 %
Eosinophils Absolute: 0 10*3/uL (ref 0.0–0.5)
Eosinophils Relative: 0 %
HCT: 47.8 % (ref 39.0–52.0)
Hemoglobin: 15.9 g/dL (ref 13.0–17.0)
Immature Granulocytes: 2 %
Lymphocytes Relative: 4 %
Lymphs Abs: 0.8 10*3/uL (ref 0.7–4.0)
MCH: 30.1 pg (ref 26.0–34.0)
MCHC: 33.3 g/dL (ref 30.0–36.0)
MCV: 90.5 fL (ref 80.0–100.0)
Monocytes Absolute: 1 10*3/uL (ref 0.1–1.0)
Monocytes Relative: 4 %
Neutro Abs: 19.7 10*3/uL — ABNORMAL HIGH (ref 1.7–7.7)
Neutrophils Relative %: 90 %
Platelets: 415 10*3/uL — ABNORMAL HIGH (ref 150–400)
RBC: 5.28 MIL/uL (ref 4.22–5.81)
RDW: 12.5 % (ref 11.5–15.5)
WBC: 22 10*3/uL — ABNORMAL HIGH (ref 4.0–10.5)
nRBC: 0 % (ref 0.0–0.2)

## 2019-06-25 LAB — GLUCOSE, CAPILLARY
Glucose-Capillary: 264 mg/dL — ABNORMAL HIGH (ref 70–99)
Glucose-Capillary: 275 mg/dL — ABNORMAL HIGH (ref 70–99)
Glucose-Capillary: 285 mg/dL — ABNORMAL HIGH (ref 70–99)
Glucose-Capillary: 366 mg/dL — ABNORMAL HIGH (ref 70–99)
Glucose-Capillary: 429 mg/dL — ABNORMAL HIGH (ref 70–99)
Glucose-Capillary: 436 mg/dL — ABNORMAL HIGH (ref 70–99)
Glucose-Capillary: 437 mg/dL — ABNORMAL HIGH (ref 70–99)

## 2019-06-25 LAB — C-REACTIVE PROTEIN: CRP: 2.7 mg/dL — ABNORMAL HIGH (ref <1.0)

## 2019-06-25 LAB — D-DIMER, QUANTITATIVE: D-Dimer, Quant: 1.45 ug/mL-FEU — ABNORMAL HIGH (ref 0.00–0.50)

## 2019-06-25 LAB — FERRITIN: Ferritin: 1025 ng/mL — ABNORMAL HIGH (ref 24–336)

## 2019-06-25 LAB — PHOSPHORUS: Phosphorus: 3.8 mg/dL (ref 2.5–4.6)

## 2019-06-25 MED ORDER — ENOXAPARIN SODIUM 60 MG/0.6ML ~~LOC~~ SOLN
0.5000 mg/kg | SUBCUTANEOUS | Status: DC
  Administered 2019-06-25 – 2019-06-27 (×3): 55 mg via SUBCUTANEOUS
  Filled 2019-06-25 (×3): qty 0.6

## 2019-06-25 MED ORDER — INSULIN DETEMIR 100 UNIT/ML ~~LOC~~ SOLN
14.0000 [IU] | Freq: Two times a day (BID) | SUBCUTANEOUS | Status: DC
  Administered 2019-06-25 (×2): 14 [IU] via SUBCUTANEOUS
  Filled 2019-06-25 (×3): qty 0.14

## 2019-06-25 MED ORDER — INSULIN ASPART 100 UNIT/ML ~~LOC~~ SOLN
10.0000 [IU] | Freq: Three times a day (TID) | SUBCUTANEOUS | Status: DC
  Administered 2019-06-25 – 2019-06-27 (×7): 10 [IU] via SUBCUTANEOUS

## 2019-06-25 MED ORDER — DEXAMETHASONE SODIUM PHOSPHATE 10 MG/ML IJ SOLN
6.0000 mg | INTRAMUSCULAR | Status: DC
  Administered 2019-06-26 – 2019-06-28 (×3): 6 mg via INTRAVENOUS
  Filled 2019-06-25 (×3): qty 1

## 2019-06-25 MED ORDER — DEXAMETHASONE SODIUM PHOSPHATE 10 MG/ML IJ SOLN: 6.0000 mg | INTRAMUSCULAR | Status: DC

## 2019-06-25 MED ORDER — FUROSEMIDE 10 MG/ML IJ SOLN
40.0000 mg | Freq: Four times a day (QID) | INTRAMUSCULAR | Status: AC
  Administered 2019-06-25 (×2): 40 mg via INTRAVENOUS
  Filled 2019-06-25 (×2): qty 4

## 2019-06-25 NOTE — Progress Notes (Signed)
PROGRESS NOTE    Nicholas Marks  ZOX:[id number] DOB: 06/03/1956 DOA: 06/21/2019 PCP: Patient, No Pcp Per   Brief Narrative:  Nicholas Marks is a 63 y.o. male who denies any significant past medical history now presents emergency department for evaluation of progressive shortness of breath and low oxygen saturations at home.  Patient reports that he developed upper respiratory symptoms, fevers, headache, and general malaise little over a week ago, was found to be positive for COVID-19 (outpatient test from 06/13/2019 is posted in media tab in electronic medical record), started an antibiotic and steroid, has been taking acetaminophen at home, reports resolution of fevers and headache over the past couple days, but has had progressive dyspnea.  Patient has home pulse oximeter and noted that his saturation was dipping into the mid to upper 70s with minimal exertion today.  He has had a cough that is productive of thick dark sputum, denies chest pain or leg swelling, denies leg tenderness, and denies any abdominal pain.  ED Course: Upon arrival to the ED, patient is found to be afebrile, saturating 78% on room air, tachypneic to 30, and with stable blood pressure.  EKG features a sinus rhythm and chest x-ray is concerning for focal infiltrate in the left lower lobe.  Chemistry panel is notable for slight hyponatremia and mild elevation in transaminases.  CBC features a leukocytosis to 13,500.  Lactic acid is normal.  D-dimer is elevated to 2.38.  Blood cultures were collected in the emergency department and the patient was treated with Decadron and supplemental oxygen.  Assessment & Plan:   Principal Problem:   Pneumonia due to COVID-19 virus  1. Pneumonia; COVID-19; acute hypoxic respiratory failure  - Comfortable on 2 L today, continue weaning as tolerated - CXR on 12/1 concerning for focal infiltrate in LLL -> started on abx for possible superimposed bacterial pneumonia - CXR 12/2 with bilateral  airspace disease with progression on L  - CXR 12/3 stable - Continue steroids, remdesivir (day 5 today) - s/p plasma 12/2  - continue abx at this point, procalcitonin trending down - will complete 5 day course. - will hold off on actemra at this time given concern for possible bacterial pneumonia at presentation, though would consider this if he were to worsen.  Pt was notably taking steroids and abx prior to admission. - I/O, daily weights - continue lasix as tolerated - prone as able, OOB as able, IS - Daily inflammatory labs - overall improving  COVID-19 Labs  Recent Labs    06/23/19 0018 06/24/19 0218 06/25/19 0116  DDIMER 1.78* 1.13* 1.45*  FERRITIN 1,222* 1,238* 1,025*  CRP 11.8* 4.9* 2.7*    No results found for: SARSCOV2NAA  2. Elevated d-dimer  L>R LE Edema - D-dimer is elevated to 2.38 in ED  - There is no chest pain or hemoptysis, and no evidence for DVT, and this is likely secondary to the acute infectious illness  - Continue to treat pneumonia and trend  - follow LE [place] - no evidence of DVT  Hyperglycemia: follow A1c (7.1).  Start basal and bolus regimen.  SSI.  Adjust as needed.  Elevated ALT: continue to monitor, possibly related to COVID  Leukocytosis: 2/2 steroids, follow  DVT prophylaxis: lovenox Code Status: full  Family Communication: wife over phone 12/4 Disposition Plan: pending further improvement  Consultants:   none  Procedures:   none  Antimicrobials:  Anti-infectives (From admission, onward)   Start     Dose/Rate Route Frequency  Ordered Stop   06/25/19 1000  remdesivir 100 mg in sodium chloride 0.9 % 100 mL IVPB     100 mg 200 mL/hr over 30 Minutes Intravenous Daily 06/24/19 2126 06/25/19 0937   06/22/19 1000  remdesivir 100 mg in sodium chloride 0.9 % 250 mL IVPB  Status:  Discontinued     100 mg 500 mL/hr over 30 Minutes Intravenous Every 24 hours 06/21/19 1956 06/24/19 2124   06/21/19 2100  cefTRIAXone (ROCEPHIN) 2 g in sodium  chloride 0.9 % 100 mL IVPB     2 g 200 mL/hr over 30 Minutes Intravenous Every 24 hours 06/21/19 2047 06/26/19 2059   06/21/19 2100  azithromycin (ZITHROMAX) 500 mg in sodium chloride 0.9 % 250 mL IVPB     500 mg 250 mL/hr over 60 Minutes Intravenous Every 24 hours 06/21/19 2047 06/26/19 2059   06/21/19 2100  remdesivir 200 mg in sodium chloride 0.9 % 250 mL IVPB     200 mg 500 mL/hr over 30 Minutes Intravenous Once 06/21/19 1956 06/21/19 2211     Subjective: Feels better today.   Objective: Vitals:   06/25/19 0435 06/25/19 0500 06/25/19 0800 06/25/19 1200  BP: (!) 147/86  (!) 144/86 (!) 160/83  Pulse: 89  86 99  Resp: 20  (!) 23 (!) 21  Temp: 98.2 F (36.8 C)  98.7 F (37.1 C) 98.7 F (37.1 C)  TempSrc: Oral  Oral Oral  SpO2: 96%  97% 97%  Weight:  114 kg    Height:        Intake/Output Summary (Last 24 hours) at 06/25/2019 1616 Last data filed at 06/25/2019 1400 Gross per 24 hour  Intake -  Output 2025 ml  Net -2025 ml   Filed Weights   06/21/19 1600 06/22/19 0147 06/25/19 0500  Weight: 117.9 kg 117.9 kg 114 kg    Examination:  General: No acute distress. Cardiovascular: RRR Lungs: Clear to auscultation bilaterally with good air movement. Abdomen: Soft, nontender, nondistended  Neurological: Alert and oriented 3. Moves all extremities 4 . Cranial nerves II through XII grossly intact. Skin: Warm and dry. No rashes or lesions. Extremities: No clubbing or cyanosis. Trace edema    Data Reviewed: I have personally reviewed following labs and imaging studies  CBC: Recent Labs  Lab 06/21/19 1721 06/22/19 0805 06/23/19 0018 06/24/19 0218 06/25/19 0116  WBC 13.5* 9.8 14.3* 20.3* 22.0*  NEUTROABS 11.1* 8.1* 12.0* 17.7* 19.7*  HGB 16.2 15.7 15.2 15.5 15.9  HCT 48.4 46.6 46.0 46.8 47.8  MCV 90.8 91.0 92.2 90.9 90.5  PLT 277 267 280 368 415*   Basic Metabolic Panel: Recent Labs  Lab 06/22/19 0805 06/23/19 0018 06/24/19 0218 06/24/19 2028 06/25/19  0116  NA 138 138 138 136 138  K 4.5 4.4 4.3 4.7 4.3  CL 103 101 102 97* 99  CO2 22 20* 23 22 24   GLUCOSE 172* 230* 222* 437* 286*  BUN 16 24* 29* 36* 36*  CREATININE 0.84 0.88 0.92 1.13 0.97  CALCIUM 8.4* 8.8* 8.7* 9.3 8.9  MG 2.0 2.2 2.1  --  2.2  PHOS 3.1 2.8 3.4  --  3.8   GFR: Estimated Creatinine Clearance: 95.1 mL/min (by C-G formula based on SCr of 0.97 mg/dL). Liver Function Tests: Recent Labs  Lab 06/21/19 1721 06/22/19 0805 06/23/19 0018 06/24/19 0218 06/25/19 0116  AST 48* 50* 43* 30 31  ALT 55* 63* 76* 77* 97*  ALKPHOS 59 60 61 65 68  BILITOT 1.0 0.6 0.4  0.5 0.5  PROT 7.4 6.8 6.9 6.8 7.0  ALBUMIN 3.1* 3.0* 3.0* 2.9* 3.1*   No results for input(s): LIPASE, AMYLASE in the last 168 hours. No results for input(s): AMMONIA in the last 168 hours. Coagulation Profile: No results for input(s): INR, PROTIME in the last 168 hours. Cardiac Enzymes: No results for input(s): CKTOTAL, CKMB, CKMBINDEX, TROPONINI in the last 168 hours. BNP (last 3 results) No results for input(s): PROBNP in the last 8760 hours. HbA1C: No results for input(s): HGBA1C in the last 72 hours. CBG: Recent Labs  Lab 06/24/19 1949 06/24/19 2353 06/25/19 0521 06/25/19 0816 06/25/19 1144  GLUCAP 415* 275* 285* 264* 437*   Lipid Profile: No results for input(s): CHOL, HDL, LDLCALC, TRIG, CHOLHDL, LDLDIRECT in the last 72 hours. Thyroid Function Tests: No results for input(s): TSH, T4TOTAL, FREET4, T3FREE, THYROIDAB in the last 72 hours. Anemia Panel: Recent Labs    06/24/19 0218 06/25/19 0116  FERRITIN 1,238* 1,025*   Sepsis Labs: Recent Labs  Lab 06/21/19 1721 06/22/19 1130 06/23/19 0018 06/24/19 0218  PROCALCITON 0.32 0.20 0.18 0.14  LATICACIDVEN 1.8  --   --   --     Recent Results (from the past 240 hour(s))  Blood Culture (routine x 2)     Status: None (Preliminary result)   Collection Time: 06/21/19  5:21 PM   Specimen: BLOOD LEFT FOREARM  Result Value Ref Range  Status   Specimen Description   Final    BLOOD LEFT FOREARM Performed at [facility] [address] [place], [place] [address]    Special Requests   Final    BOTTLES DRAWN AEROBIC AND ANAEROBIC Blood Culture adequate volume Performed at [facility] [address], [place], [place] [address]    Culture   Final    NO GROWTH 4 DAYS Performed at [facility], [place] [address] [place], [place] [address]    Report Status PENDING  Incomplete  Blood Culture (routine x 2)     Status: None (Preliminary result)   Collection Time: 06/21/19  5:21 PM   Specimen: BLOOD  Result Value Ref Range Status   Specimen Description   Final    BLOOD BLOOD LEFT HAND Performed at [facility] [address], [place], [place] [address]    Special Requests   Final    BOTTLES DRAWN AEROBIC AND ANAEROBIC Blood Culture adequate volume Performed at [facility] [address], [place], [place] [address]    Culture   Final    NO GROWTH 4 DAYS Performed at [facility], [place] [address] [place], [place] [address]    Report Status PENDING  Incomplete         Radiology Studies: Dg Chest Port 1 View  Result Date: 06/24/2019 CLINICAL DATA:  Hypoxia. EXAM: PORTABLE CHEST 1 VIEW COMPARISON:  06/23/2019 FINDINGS: Lungs are adequately inflated with stable bilateral patchy hazy airspace opacification likely infection. No effusion. Mild stable cardiomegaly. Remainder of the exam is unchanged. IMPRESSION: Stable hazy patchy bilateral airspace process likely infection. Electronically Signed   By: Marin Olp M.D.   On: 06/24/2019 08:55   Vas [place] Lower Extremity Venous (dvt)  Result Date: 06/25/2019  Lower Venous Study Indications: Elevated Ddimer.  Risk Factors: COVID 19 positive. Limitations: Poor ultrasound/tissue interface. Comparison Study: No prior studies. Performing Technologist: Oliver Hum RVT  Examination  Guidelines: A complete evaluation includes B-mode imaging, spectral Doppler, color Doppler, and power Doppler  as needed of all accessible portions of each vessel. Bilateral testing is considered an integral part of a complete examination. Limited examinations for reoccurring indications may be performed as noted.  +---------+---------------+---------+-----------+----------+--------------+ RIGHT    CompressibilityPhasicitySpontaneityPropertiesThrombus Aging +---------+---------------+---------+-----------+----------+--------------+ CFV      Full           Yes      Yes                                 +---------+---------------+---------+-----------+----------+--------------+ SFJ      Full                                                        +---------+---------------+---------+-----------+----------+--------------+ FV Prox  Full                                                        +---------+---------------+---------+-----------+----------+--------------+ FV Mid   Full                                                        +---------+---------------+---------+-----------+----------+--------------+ FV DistalFull                                                        +---------+---------------+---------+-----------+----------+--------------+ PFV      Full                                                        +---------+---------------+---------+-----------+----------+--------------+ POP      Full           Yes      Yes                                 +---------+---------------+---------+-----------+----------+--------------+ PTV      Full                                                        +---------+---------------+---------+-----------+----------+--------------+ PERO     Full                                                        +---------+---------------+---------+-----------+----------+--------------+    +---------+---------------+---------+-----------+----------+--------------+ LEFT     CompressibilityPhasicitySpontaneityPropertiesThrombus Aging +---------+---------------+---------+-----------+----------+--------------+ CFV  Full           Yes      Yes                                 +---------+---------------+---------+-----------+----------+--------------+ SFJ      Full                                                        +---------+---------------+---------+-----------+----------+--------------+ FV Prox  Full                                                        +---------+---------------+---------+-----------+----------+--------------+ FV Mid   Full                                                        +---------+---------------+---------+-----------+----------+--------------+ FV DistalFull                                                        +---------+---------------+---------+-----------+----------+--------------+ PFV      Full                                                        +---------+---------------+---------+-----------+----------+--------------+ POP      Full           Yes      Yes                                 +---------+---------------+---------+-----------+----------+--------------+ PTV      Full                                                        +---------+---------------+---------+-----------+----------+--------------+ PERO     Full                                                        +---------+---------------+---------+-----------+----------+--------------+     Summary: Right: There is no evidence of deep vein thrombosis in the lower extremity. No cystic structure found in the popliteal fossa. Left: There is no evidence of deep vein thrombosis in the lower extremity. No cystic structure found in the popliteal fossa.  *See table(s) above for measurements and observations. Electronically signed by  Waverly Ferrari MD  on 06/25/2019 at 1:54:56 PM.    Final         Scheduled Meds: . [START ON 06/26/2019] dexamethasone (DECADRON) injection  6 mg Intravenous Q24H  . enoxaparin (LOVENOX) injection  0.5 mg/kg Subcutaneous Q24H  . famotidine  20 mg Oral BID  . furosemide  40 mg Intravenous Q6H  . insulin aspart  0-20 Units Subcutaneous Q4H  . insulin aspart  10 Units Subcutaneous TID WC  . insulin detemir  14 Units Subcutaneous BID  . mouth rinse  15 mL Mouth Rinse BID  . methylPREDNISolone (SOLU-MEDROL) injection  60 mg Intravenous Q12H   Continuous Infusions: . azithromycin 500 mg (06/24/19 2038)  . cefTRIAXone (ROCEPHIN)  IV 2 g (06/24/19 2225)     LOS: 4 days    Time spent: over 30 min    Lacretia Nicksaldwell Powell, MD [facility] Pager AMION  If 7PM-7AM, please contact night-coverage [email or web address] Password [email or web address] 06/25/2019, 4:16 PM

## 2019-06-25 NOTE — Progress Notes (Signed)
Bilateral lower extremity venous duplex has been completed. Preliminary results can be found in CV Proc through chart review.   06/25/19 8:53 AM Nicholas Marks RVT

## 2019-06-26 ENCOUNTER — Inpatient Hospital Stay (HOSPITAL_COMMUNITY): Payer: BC Managed Care – PPO

## 2019-06-26 LAB — CBC WITH DIFFERENTIAL/PLATELET
Abs Immature Granulocytes: 0.35 10*3/uL — ABNORMAL HIGH (ref 0.00–0.07)
Basophils Absolute: 0.1 10*3/uL (ref 0.0–0.1)
Basophils Relative: 0 %
Eosinophils Absolute: 0 10*3/uL (ref 0.0–0.5)
Eosinophils Relative: 0 %
HCT: 46.2 % (ref 39.0–52.0)
Hemoglobin: 15.7 g/dL (ref 13.0–17.0)
Immature Granulocytes: 2 %
Lymphocytes Relative: 4 %
Lymphs Abs: 0.7 10*3/uL (ref 0.7–4.0)
MCH: 30.7 pg (ref 26.0–34.0)
MCHC: 34 g/dL (ref 30.0–36.0)
MCV: 90.2 fL (ref 80.0–100.0)
Monocytes Absolute: 1 10*3/uL (ref 0.1–1.0)
Monocytes Relative: 5 %
Neutro Abs: 15.8 10*3/uL — ABNORMAL HIGH (ref 1.7–7.7)
Neutrophils Relative %: 89 %
Platelets: 426 10*3/uL — ABNORMAL HIGH (ref 150–400)
RBC: 5.12 MIL/uL (ref 4.22–5.81)
RDW: 12.4 % (ref 11.5–15.5)
WBC: 17.9 10*3/uL — ABNORMAL HIGH (ref 4.0–10.5)
nRBC: 0 % (ref 0.0–0.2)

## 2019-06-26 LAB — COMPREHENSIVE METABOLIC PANEL
ALT: 70 U/L — ABNORMAL HIGH (ref 0–44)
AST: 18 U/L (ref 15–41)
Albumin: 3.1 g/dL — ABNORMAL LOW (ref 3.5–5.0)
Alkaline Phosphatase: 62 U/L (ref 38–126)
Anion gap: 15 (ref 5–15)
BUN: 33 mg/dL — ABNORMAL HIGH (ref 8–23)
CO2: 26 mmol/L (ref 22–32)
Calcium: 8.5 mg/dL — ABNORMAL LOW (ref 8.9–10.3)
Chloride: 98 mmol/L (ref 98–111)
Creatinine, Ser: 0.98 mg/dL (ref 0.61–1.24)
GFR calc Af Amer: >60 mL/min (ref >60)
GFR calc non Af Amer: >60 mL/min (ref >60)
Glucose, Bld: 242 mg/dL — ABNORMAL HIGH (ref 70–99)
Potassium: 4.5 mmol/L (ref 3.5–5.1)
Sodium: 139 mmol/L (ref 135–145)
Total Bilirubin: 0.8 mg/dL (ref 0.3–1.2)
Total Protein: 6.6 g/dL (ref 6.5–8.1)

## 2019-06-26 LAB — MAGNESIUM: Magnesium: 2.3 mg/dL (ref 1.7–2.4)

## 2019-06-26 LAB — CULTURE, BLOOD (ROUTINE X 2)
Culture: NO GROWTH
Culture: NO GROWTH
Special Requests: ADEQUATE
Special Requests: ADEQUATE

## 2019-06-26 LAB — PHOSPHORUS: Phosphorus: 3.8 mg/dL (ref 2.5–4.6)

## 2019-06-26 LAB — C-REACTIVE PROTEIN: CRP: 1.1 mg/dL — ABNORMAL HIGH (ref <1.0)

## 2019-06-26 LAB — GLUCOSE, CAPILLARY
Glucose-Capillary: 195 mg/dL — ABNORMAL HIGH (ref 70–99)
Glucose-Capillary: 241 mg/dL — ABNORMAL HIGH (ref 70–99)
Glucose-Capillary: 296 mg/dL — ABNORMAL HIGH (ref 70–99)
Glucose-Capillary: 325 mg/dL — ABNORMAL HIGH (ref 70–99)
Glucose-Capillary: 392 mg/dL — ABNORMAL HIGH (ref 70–99)
Glucose-Capillary: 424 mg/dL — ABNORMAL HIGH (ref 70–99)

## 2019-06-26 LAB — MRSA PCR SCREENING: MRSA by PCR: NEGATIVE

## 2019-06-26 LAB — FERRITIN: Ferritin: 951 ng/mL — ABNORMAL HIGH (ref 24–336)

## 2019-06-26 LAB — D-DIMER, QUANTITATIVE: D-Dimer, Quant: 1.49 ug/mL-FEU — ABNORMAL HIGH (ref 0.00–0.50)

## 2019-06-26 MED ORDER — INSULIN DETEMIR 100 UNIT/ML ~~LOC~~ SOLN
18.0000 [IU] | Freq: Two times a day (BID) | SUBCUTANEOUS | Status: DC
  Administered 2019-06-26 – 2019-06-28 (×5): 18 [IU] via SUBCUTANEOUS
  Filled 2019-06-26 (×6): qty 0.18

## 2019-06-26 MED ORDER — FUROSEMIDE 10 MG/ML IJ SOLN
40.0000 mg | Freq: Four times a day (QID) | INTRAMUSCULAR | Status: AC
  Administered 2019-06-26 (×2): 40 mg via INTRAVENOUS
  Filled 2019-06-26 (×2): qty 4

## 2019-06-26 NOTE — Progress Notes (Signed)
PROGRESS NOTE    Nicholas Marks  WUJ:[id number] DOB: 10/29/1955 DOA: 06/21/2019 PCP: Patient, No Pcp Per   Brief Narrative:  Nicholas Marks is Nicholas Marks 63 y.o. male who denies any significant past medical history now presents emergency department for evaluation of progressive shortness of breath and low oxygen saturations at home.  Patient reports that he developed upper respiratory symptoms, fevers, headache, and general malaise little over Tatisha Cerino week ago, was found to be positive for COVID-19 (outpatient test from 06/13/2019 is posted in media tab in electronic medical record), started an antibiotic and steroid, has been taking acetaminophen at home, reports resolution of fevers and headache over the past couple days, but has had progressive dyspnea.  Patient has home pulse oximeter and noted that his saturation was dipping into the mid to upper 70s with minimal exertion today.  He has had Elvena Oyer cough that is productive of thick dark sputum, denies chest pain or leg swelling, denies leg tenderness, and denies any abdominal pain.  ED Course: Upon arrival to the ED, patient is found to be afebrile, saturating 78% on room air, tachypneic to 30, and with stable blood pressure.  EKG features Janavia Rottman sinus rhythm and chest x-ray is concerning for focal infiltrate in the left lower lobe.  Chemistry panel is notable for slight hyponatremia and mild elevation in transaminases.  CBC features Izora Benn leukocytosis to 13,500.  Lactic acid is normal.  D-dimer is elevated to 2.38.  Blood cultures were collected in the emergency department and the patient was treated with Decadron and supplemental oxygen.  Assessment & Plan:   Principal Problem:   Pneumonia due to COVID-19 virus  1. Pneumonia; COVID-19; acute hypoxic respiratory failure  - Able to wean to RA at bedside.  Per nursing, they needed to increase it back later in the day. - CXR on 12/1 concerning for focal infiltrate in LLL -> started on abx for possible superimposed bacterial  pneumonia - CXR 12/2 with bilateral airspace disease with progression on L  - CXR 12/3 stable - Continue steroids (day 6), remdesivir (complete) - s/p plasma 12/2  - continue abx at this point, procalcitonin trending down - will complete 5 day course. - will hold off on actemra at this time given concern for possible bacterial pneumonia at presentation, though would consider this if he were to worsen.  Pt was notably taking steroids and abx prior to admission. - I/O, daily weights - continue lasix as tolerated - prone as able, OOB as able, IS - Daily inflammatory labs - overall improving - CXR 12/6 stable with persistent interstitial and airspace opacities  COVID-19 Labs  Recent Labs    06/24/19 0218 06/25/19 0116 06/26/19 0326  DDIMER 1.13* 1.45* 1.49*  FERRITIN 1,238* 1,025* 951*  CRP 4.9* 2.7* 1.1*    No results found for: SARSCOV2NAA  2. Elevated d-dimer   L>R LE Edema - D-dimer is elevated to 2.38 in ED  - There is no chest pain or hemoptysis, and no evidence for DVT, and this is likely secondary to the acute infectious illness  - Continue to treat pneumonia and trend  - follow LE US - no evidence of DVT  Hyperglycemia: follow A1c (7.1).  Start basal and bolus regimen.  SSI.  Will continue to adjust as needed.  Elevated ALT: continue to monitor, possibly related to COVID  Leukocytosis: 2/2 steroids, follow  DVT prophylaxis: lovenox Code Status: full  Family Communication: wife over phone 12/6 Disposition Plan: pending further improvement  Consultants:  none  Procedures:   none  Antimicrobials:  Anti-infectives (From admission, onward)   Start     Dose/Rate Route Frequency Ordered Stop   06/25/19 1000  remdesivir 100 mg in sodium chloride 0.9 % 100 mL IVPB     100 mg 200 mL/hr over 30 Minutes Intravenous Daily 06/24/19 2126 06/25/19 0937   06/22/19 1000  remdesivir 100 mg in sodium chloride 0.9 % 250 mL IVPB  Status:  Discontinued     100 mg 500 mL/hr  over 30 Minutes Intravenous Every 24 hours 06/21/19 1956 06/24/19 2124   06/21/19 2100  cefTRIAXone (ROCEPHIN) 2 g in sodium chloride 0.9 % 100 mL IVPB     2 g 200 mL/hr over 30 Minutes Intravenous Every 24 hours 06/21/19 2047 06/25/19 2233   06/21/19 2100  azithromycin (ZITHROMAX) 500 mg in sodium chloride 0.9 % 250 mL IVPB     500 mg 250 mL/hr over 60 Minutes Intravenous Every 24 hours 06/21/19 2047 06/25/19 2100   06/21/19 2100  remdesivir 200 mg in sodium chloride 0.9 % 250 mL IVPB     200 mg 500 mL/hr over 30 Minutes Intravenous Once 06/21/19 1956 06/21/19 2211     Subjective: Feels better today.   Objective: Vitals:   06/26/19 1000 06/26/19 1130 06/26/19 1203 06/26/19 1532  BP:   137/83   Pulse:  99 93 100  Resp:  (!) 24 19 (!) 24  Temp: 98.5 F (36.9 C) 98.1 F (36.7 C)    TempSrc: Oral     SpO2: (!) 89% (!) 85% 90% 91%  Weight:      Height:        Intake/Output Summary (Last 24 hours) at 06/26/2019 1709 Last data filed at 06/26/2019 1400 Gross per 24 hour  Intake 720 ml  Output 2325 ml  Net -1605 ml   Filed Weights   06/22/19 0147 06/25/19 0500 06/26/19 0500  Weight: 117.9 kg 114 kg 113.2 kg    Examination:  General: No acute distress. Cardiovascular: RRR Lungs: Clear to auscultation bilaterally  Abdomen: Soft, nontender, nondistended  Neurological: Alert and oriented 3. Moves all extremities 4 . Cranial nerves II through XII grossly intact. Skin: Warm and dry. No rashes or lesions. Extremities: No clubbing or cyanosis. No edema.   Data Reviewed: I have personally reviewed following labs and imaging studies  CBC: Recent Labs  Lab 06/22/19 0805 06/23/19 0018 06/24/19 0218 06/25/19 0116 06/26/19 0326  WBC 9.8 14.3* 20.3* 22.0* 17.9*  NEUTROABS 8.1* 12.0* 17.7* 19.7* 15.8*  HGB 15.7 15.2 15.5 15.9 15.7  HCT 46.6 46.0 46.8 47.8 46.2  MCV 91.0 92.2 90.9 90.5 90.2  PLT 267 280 368 415* 426*   Basic Metabolic Panel: Recent Labs  Lab  06/22/19 0805 06/23/19 0018 06/24/19 0218 06/24/19 2028 06/25/19 0116 06/26/19 0326  NA 138 138 138 136 138 139  K 4.5 4.4 4.3 4.7 4.3 4.5  CL 103 101 102 97* 99 98  CO2 22 20* 23 22 24 26   GLUCOSE 172* 230* 222* 437* 286* 242*  BUN 16 24* 29* 36* 36* 33*  CREATININE 0.84 0.88 0.92 1.13 0.97 0.98  CALCIUM 8.4* 8.8* 8.7* 9.3 8.9 8.5*  MG 2.0 2.2 2.1  --  2.2 2.3  PHOS 3.1 2.8 3.4  --  3.8 3.8   GFR: Estimated Creatinine Clearance: 93.7 mL/min (by C-G formula based on SCr of 0.98 mg/dL). Liver Function Tests: Recent Labs  Lab 06/22/19 0805 06/23/19 0018 06/24/19 0218 06/25/19 0116 06/26/19  0326  AST 50* 43* ALT 63* 76* 77* 97* 70*  ALKPHOS 60 61 65 68 62  BILITOT 0.6 0.4 0.5 0.5 0.8  PROT 6.8 6.9 6.8 7.0 6.6  ALBUMIN 3.0* 3.0* 2.9* 3.1* 3.1*   No results for input(s): LIPASE, AMYLASE in the last 168 hours. No results for input(s): AMMONIA in the last 168 hours. Coagulation Profile: No results for input(s): INR, PROTIME in the last 168 hours. Cardiac Enzymes: No results for input(s): CKTOTAL, CKMB, CKMBINDEX, TROPONINI in the last 168 hours. BNP (last 3 results) No results for input(s): PROBNP in the last 8760 hours. HbA1C: No results for input(s): HGBA1C in the last 72 hours. CBG: Recent Labs  Lab 06/25/19 2103 06/25/19 2350 06/26/19 0516 06/26/19 1153 06/26/19 1649  GLUCAP 366* 296* 241* 325* 424*   Lipid Profile: No results for input(s): CHOL, HDL, LDLCALC, TRIG, CHOLHDL, LDLDIRECT in the last 72 hours. Thyroid Function Tests: No results for input(s): TSH, T4TOTAL, FREET4, T3FREE, THYROIDAB in the last 72 hours. Anemia Panel: Recent Labs    06/25/19 0116 06/26/19 0326  FERRITIN 1,025* 951*   Sepsis Labs: Recent Labs  Lab 06/21/19 1721 06/22/19 1130 06/23/19 0018 06/24/19 0218  PROCALCITON 0.32 0.20 0.18 0.14  LATICACIDVEN 1.8  --   --   --     Recent Results (from the past 240 hour(s))  Blood Culture (routine x 2)     Status:  None   Collection Time: 06/21/19  5:21 PM   Specimen: BLOOD LEFT FOREARM  Result Value Ref Range Status   Specimen Description   Final    BLOOD LEFT FOREARM Performed at [facility], [address] [place], [place] [address]    Special Requests   Final    BOTTLES DRAWN AEROBIC AND ANAEROBIC Blood Culture adequate volume Performed at [facility], [address], [place], [place] [address]    Culture   Final    NO GROWTH 5 DAYS Performed at [facility], [address] [place], [place] [address]    Report Status 06/26/2019 FINAL  Final  Blood Culture (routine x 2)     Status: None   Collection Time: 06/21/19  5:21 PM   Specimen: BLOOD  Result Value Ref Range Status   Specimen Description   Final    BLOOD BLOOD LEFT HAND Performed at [facility], [address], [place], [place] [address]    Special Requests   Final    BOTTLES DRAWN AEROBIC AND ANAEROBIC Blood Culture adequate volume Performed at [facility], [address], [place], [place] [address]    Culture   Final    NO GROWTH 5 DAYS Performed at [facility], [address], [place], [place] [address]    Report Status 06/26/2019 FINAL  Final         Radiology Studies: Dg Chest Port 1 View  Result Date: 06/26/2019 CLINICAL DATA:  Hypoxia EXAM: PORTABLE CHEST 1 VIEW COMPARISON:  06/24/2019 FINDINGS: Cardiomediastinal contours are stable. Persistent interstitial and airspace opacities are seen in the chest without dense consolidation or evidence of pleural effusion. No acute bone finding. IMPRESSION: Stable appearance of the chest. Persistent interstitial and airspace opacities in the chest. No pleural effusion or pneumothorax is evident. Electronically Signed   By: Donzetta Kohut M.D.   On: 06/26/2019 15:21   Vas [place] Lower Extremity Venous (dvt)  Result Date: 06/25/2019  Lower Venous Study Indications:  Elevated Ddimer.  Risk  Factors: COVID 19 positive. Limitations: Poor ultrasound/tissue interface. Comparison Study: No prior studies. Performing Technologist: Chanda Busing RVT  Examination Guidelines: Lisandra Mathisen complete evaluation includes B-mode imaging, spectral Doppler, color Doppler, and power Doppler as needed of all accessible portions of each vessel. Bilateral testing is considered an integral part of Rachelanne Whidby complete examination. Limited examinations for reoccurring indications may be performed as noted.  +---------+---------------+---------+-----------+----------+--------------+  RIGHT     Compressibility Phasicity Spontaneity Properties Thrombus Aging  +---------+---------------+---------+-----------+----------+--------------+  CFV       Full            Yes       Yes                                    +---------+---------------+---------+-----------+----------+--------------+  SFJ       Full                                                             +---------+---------------+---------+-----------+----------+--------------+  FV Prox   Full                                                             +---------+---------------+---------+-----------+----------+--------------+  FV Mid    Full                                                             +---------+---------------+---------+-----------+----------+--------------+  FV Distal Full                                                             +---------+---------------+---------+-----------+----------+--------------+  PFV       Full                                                             +---------+---------------+---------+-----------+----------+--------------+  POP       Full            Yes       Yes                                    +---------+---------------+---------+-----------+----------+--------------+  PTV       Full                                                             +---------+---------------+---------+-----------+----------+--------------+  PERO      Full                                                              +---------+---------------+---------+-----------+----------+--------------+   +---------+---------------+---------+-----------+----------+--------------+  LEFT      Compressibility Phasicity Spontaneity Properties Thrombus Aging  +---------+---------------+---------+-----------+----------+--------------+  CFV       Full            Yes       Yes                                    +---------+---------------+---------+-----------+----------+--------------+  SFJ       Full                                                             +---------+---------------+---------+-----------+----------+--------------+  FV Prox   Full                                                             +---------+---------------+---------+-----------+----------+--------------+  FV Mid    Full                                                             +---------+---------------+---------+-----------+----------+--------------+  FV Distal Full                                                             +---------+---------------+---------+-----------+----------+--------------+  PFV       Full                                                             +---------+---------------+---------+-----------+----------+--------------+  POP       Full            Yes       Yes                                    +---------+---------------+---------+-----------+----------+--------------+  PTV       Full                                                             +---------+---------------+---------+-----------+----------+--------------+  PERO      Full                                                             +---------+---------------+---------+-----------+----------+--------------+     Summary: Right: There is no evidence of deep vein thrombosis in the lower extremity. No cystic structure found in the popliteal fossa. Left: There is no evidence of deep vein thrombosis in the lower extremity. No  cystic structure found in the popliteal fossa.  *See table(s) above for measurements and observations. Electronically signed by Deitra Mayo MD on 06/25/2019 at 1:54:56 PM.    Final         Scheduled Meds:  dexamethasone (DECADRON) injection  6 mg Intravenous Q24H   enoxaparin (LOVENOX) injection  0.5 mg/kg Subcutaneous Q24H   famotidine  20 mg Oral BID   insulin aspart  0-20 Units Subcutaneous Q4H   insulin aspart  10 Units Subcutaneous TID WC   insulin detemir  18 Units Subcutaneous BID   mouth rinse  15 mL Mouth Rinse BID   Continuous Infusions:    LOS: 5 days    Time spent: over 30 min    Fayrene Helper, MD [facility] Pager AMION  If 7PM-7AM, please contact night-coverage [email or web address] Password [email or web address] 06/26/2019, 5:09 PM

## 2019-06-26 NOTE — Plan of Care (Signed)

## 2019-06-26 NOTE — Progress Notes (Signed)
O2 and Spo2 Evaluation note:  RA at rest= Spo2 87% 2L [place] at rest=92 3L [place] with exertion/ambulation= 90%

## 2019-06-27 LAB — CBC WITH DIFFERENTIAL/PLATELET
Abs Immature Granulocytes: 0.34 10*3/uL — ABNORMAL HIGH (ref 0.00–0.07)
Basophils Absolute: 0.1 10*3/uL (ref 0.0–0.1)
Basophils Relative: 0 %
Eosinophils Absolute: 0 10*3/uL (ref 0.0–0.5)
Eosinophils Relative: 0 %
HCT: 48.5 % (ref 39.0–52.0)
Hemoglobin: 16.3 g/dL (ref 13.0–17.0)
Immature Granulocytes: 2 %
Lymphocytes Relative: 6 %
Lymphs Abs: 1.1 10*3/uL (ref 0.7–4.0)
MCH: 30.4 pg (ref 26.0–34.0)
MCHC: 33.6 g/dL (ref 30.0–36.0)
MCV: 90.5 fL (ref 80.0–100.0)
Monocytes Absolute: 1.6 10*3/uL — ABNORMAL HIGH (ref 0.1–1.0)
Monocytes Relative: 8 %
Neutro Abs: 15.8 10*3/uL — ABNORMAL HIGH (ref 1.7–7.7)
Neutrophils Relative %: 84 %
Platelets: 455 10*3/uL — ABNORMAL HIGH (ref 150–400)
RBC: 5.36 MIL/uL (ref 4.22–5.81)
RDW: 12.4 % (ref 11.5–15.5)
WBC: 18.9 10*3/uL — ABNORMAL HIGH (ref 4.0–10.5)
nRBC: 0 % (ref 0.0–0.2)

## 2019-06-27 LAB — COMPREHENSIVE METABOLIC PANEL
ALT: 62 U/L — ABNORMAL HIGH (ref 0–44)
AST: 18 U/L (ref 15–41)
Albumin: 3 g/dL — ABNORMAL LOW (ref 3.5–5.0)
Alkaline Phosphatase: 64 U/L (ref 38–126)
Anion gap: 16 — ABNORMAL HIGH (ref 5–15)
BUN: 39 mg/dL — ABNORMAL HIGH (ref 8–23)
CO2: 29 mmol/L (ref 22–32)
Calcium: 8.8 mg/dL — ABNORMAL LOW (ref 8.9–10.3)
Chloride: 95 mmol/L — ABNORMAL LOW (ref 98–111)
Creatinine, Ser: 0.97 mg/dL (ref 0.61–1.24)
GFR calc Af Amer: >60 mL/min (ref >60)
GFR calc non Af Amer: >60 mL/min (ref >60)
Glucose, Bld: 110 mg/dL — ABNORMAL HIGH (ref 70–99)
Potassium: 3.6 mmol/L (ref 3.5–5.1)
Sodium: 140 mmol/L (ref 135–145)
Total Bilirubin: 0.8 mg/dL (ref 0.3–1.2)
Total Protein: 6.7 g/dL (ref 6.5–8.1)

## 2019-06-27 LAB — D-DIMER, QUANTITATIVE: D-Dimer, Quant: 1.49 ug/mL-FEU — ABNORMAL HIGH (ref 0.00–0.50)

## 2019-06-27 LAB — MAGNESIUM: Magnesium: 2.3 mg/dL (ref 1.7–2.4)

## 2019-06-27 LAB — GLUCOSE, CAPILLARY
Glucose-Capillary: 189 mg/dL — ABNORMAL HIGH (ref 70–99)
Glucose-Capillary: 124 mg/dL — ABNORMAL HIGH (ref 70–99)
Glucose-Capillary: 107 mg/dL — ABNORMAL HIGH (ref 70–99)
Glucose-Capillary: 313 mg/dL — ABNORMAL HIGH (ref 70–99)
Glucose-Capillary: 356 mg/dL — ABNORMAL HIGH (ref 70–99)
Glucose-Capillary: 364 mg/dL — ABNORMAL HIGH (ref 70–99)

## 2019-06-27 LAB — FERRITIN: Ferritin: 1319 ng/mL — ABNORMAL HIGH (ref 24–336)

## 2019-06-27 LAB — C-REACTIVE PROTEIN: CRP: 0.8 mg/dL (ref <1.0)

## 2019-06-27 MED ORDER — INSULIN ASPART 100 UNIT/ML ~~LOC~~ SOLN
12.0000 [IU] | Freq: Three times a day (TID) | SUBCUTANEOUS | Status: DC
  Administered 2019-06-28 (×2): 12 [IU] via SUBCUTANEOUS

## 2019-06-27 NOTE — Progress Notes (Signed)
Occupational Therapy Treatment Patient Details Name: Nicholas Marks MRN: [id number] DOB: 08-20-1955 Today's Date: 06/27/2019    History of present illness 63 y.o. male who denies any sig PMHx, presented to ED w/  progressive SOB and hypoxia at home. He reports upper respiratory sx, fevers, headache, and general malaise for over  1 week, was found to beCOVID-19 + (outpatient test) 06/13/2019. was started on antibiotic and steroid w/ resolution of fevers and headache, but c/o progressive dyspnea and contiued hypoxia at home. Pt admitted w/ PNA sec to COVID.   OT comments  Pt progressing towards established OT goals and is very motivated to participate in therapy. Pt continues to present with decreased activity tolerance as seen by decreased SpO2 with ADLs and mobility. During oral care at sink, pt SpO2 dropping to 84% on RA. Pt able to purse lip breathing and pt able to elevate SpO2 to 89-87% on RA. During functional mobility in hallway and pt dropping to 79% on RA; unable to bring SpO2 up past 85%; placed on 2L O2 and pt was able to maintain SpO2 at 90-85%. Continue to recommend dc to home once medically stable. Will continue to follow acutely as admitted.    Follow Up Recommendations  No OT follow up;Supervision - Intermittent    Equipment Recommendations  None recommended by OT    Recommendations for Other Services      Precautions / Restrictions Precautions Precautions: Fall Precaution Comments: desats w/ activity Restrictions Weight Bearing Restrictions: No       Mobility Bed Mobility Overal bed mobility: Independent             General bed mobility comments: Pt sitting in recliner at therapist arrival  Transfers Overall transfer level: Needs assistance   Transfers: Sit to/from Stand Sit to Stand: Supervision         General transfer comment: Supervision for safety    Balance Overall balance assessment: No apparent balance deficits (not formally  assessed) Sitting-balance support: Feet unsupported Sitting balance-Leahy Scale: Fair     Standing balance support: During functional activity;Single extremity supported Standing balance-Leahy Scale: Fair                             ADL either performed or assessed with clinical judgement   ADL Overall ADL's : Needs assistance/impaired     Grooming: Modified independent;Oral care;Standing Grooming Details (indicate cue type and reason): SpO2 dropping to 84% on RA during oral care. Cued for purse lip breathing and pt able to elevate to 88%.                             Functional mobility during ADLs: Modified independent General ADL Comments: Pt performing grooming at sink and then mobility in hallway. Very motivated to particiapte in therapy. Continue to present with decreased activity tolerance as seen by Spo2 dropping to low 80s on RA.     Vision       Perception     Praxis      Cognition Arousal/Alertness: Awake/alert Behavior During Therapy: WFL for tasks assessed/performed Overall Cognitive Status: Within Functional Limits for tasks assessed                                 General Comments: Very motivated        Exercises  Shoulder Instructions       General Comments SpO2 89-87% on RA at rest. SpO2 dropping to 84% with oral care. Pt able to purse lip breathing and bringing SpO2 to 89-87% on RA. During functional mobility in hallway and pt dropping to 79% on RA; unable to bring SpO2 up past 85% so placed on 2L O2. On 2L, pt was able to maintaing SpO2 at 90-85%. At rest on 2L, pt stats at 94%    Pertinent Vitals/ Pain       Pain Assessment: Faces Faces Pain Scale: Hurts little more Pain Location: w/ general increased WOB Pain Descriptors / Indicators: Guarding;Grimacing Pain Intervention(s): Monitored during session;Limited activity within patient's tolerance;Repositioned  Home Living                                           Prior Functioning/Environment              Frequency  Min 3X/week        Progress Toward Goals  OT Goals(current goals can now be found in the care plan section)  Progress towards OT goals: Progressing toward goals  Acute Rehab OT Goals Patient Stated Goal: to get better and be OK OT Goal Formulation: With patient Time For Goal Achievement: 07/08/19 Potential to Achieve Goals: Good ADL Goals Additional ADL Goal #1: Pt will independently verbalize 3 energy conservation strategies Additional ADL Goal #2: Pt will copmlete ADL and functional mobility for ADL with SpO2 sustaining above 88 using pursed lip breathing techniques  Plan Discharge plan remains appropriate    Co-evaluation                 AM-PAC OT "6 Clicks" Daily Activity     Outcome Measure   Help from another person eating meals?: None Help from another person taking care of personal grooming?: None Help from another person toileting, which includes using toliet, bedpan, or urinal?: None Help from another person bathing (including washing, rinsing, drying)?: A Little Help from another person to put on and taking off regular upper body clothing?: None Help from another person to put on and taking off regular lower body clothing?: A Little 6 Click Score: 22    End of Session Equipment Utilized During Treatment: Oxygen(2L)  OT Visit Diagnosis: Muscle weakness (generalized) (M62.81)   Activity Tolerance Patient tolerated treatment well   Patient Left in chair;with call bell/phone within reach   Nurse Communication Mobility status(Pt safe to ambulate independently to bathroom)        Time: 8115-7262 OT Time Calculation (min): 29 min  Charges: OT General Charges $OT Visit: 1 Visit OT Treatments $Self Care/Home Management : 23-37 mins  [place], OTR/L Acute Rehab Pager: [phone] Office: [place] 06/27/2019, 1:47  PM

## 2019-06-27 NOTE — Progress Notes (Signed)
PROGRESS NOTE    MURRELL DOME  ZOX:[id number] DOB: 08-03-1955 DOA: 06/21/2019 PCP: Patient, No Pcp Per   Brief Narrative:  MIHIR FLANIGAN is Nicholas Marks 63 y.o. male who denies any significant past medical history now presents emergency department for evaluation of progressive shortness of breath and low oxygen saturations at home.  Patient reports that he developed upper respiratory symptoms, fevers, headache, and general malaise little over Makell Cyr week ago, was found to be positive for COVID-19 (outpatient test from 06/13/2019 is posted in media tab in electronic medical record), started an antibiotic and steroid, has been taking acetaminophen at home, reports resolution of fevers and headache over the past couple days, but has had progressive dyspnea.  Patient has home pulse oximeter and noted that his saturation was dipping into the mid to upper 70s with minimal exertion today.  He has had Yoshito Gaza cough that is productive of thick dark sputum, denies chest pain or leg swelling, denies leg tenderness, and denies any abdominal pain.  ED Course: Upon arrival to the ED, patient is found to be afebrile, saturating 78% on room air, tachypneic to 30, and with stable blood pressure.  EKG features Fern Canova sinus rhythm and chest x-ray is concerning for focal infiltrate in the left lower lobe.  Chemistry panel is notable for slight hyponatremia and mild elevation in transaminases.  CBC features Myrtis Maille leukocytosis to 13,500.  Lactic acid is normal.  D-dimer is elevated to 2.38.  Blood cultures were collected in the emergency department and the patient was treated with Decadron and supplemental oxygen.  Assessment & Plan:   Principal Problem:   Pneumonia due to COVID-19 virus  1. Pneumonia; COVID-19; acute hypoxic respiratory failure  - Currently on 1 L - hopefully will continue to improve - plan for home O2 screen 12/8 and possible discharge - CXR on 12/1 concerning for focal infiltrate in LLL -> started on abx for possible  superimposed bacterial pneumonia - CXR 12/2 with bilateral airspace disease with progression on L  - CXR 12/3 stable - Continue steroids (day 7), remdesivir (complete) - s/p plasma 12/2  - continue abx at this point, procalcitonin trending down - will complete 5 day course. - will hold off on actemra at this time given concern for possible bacterial pneumonia at presentation, though would consider this if he were to worsen.  Pt was notably taking steroids and abx prior to admission. - I/O, daily weights - continue lasix as tolerated - prone as able, OOB as able, IS - Daily inflammatory labs - overall improving - CXR 12/6 stable with persistent interstitial and airspace opacities  COVID-19 Labs  Recent Labs    06/25/19 0116 06/26/19 0326 06/27/19 0130  DDIMER 1.45* 1.49* 1.49*  FERRITIN 1,025* 951* 1,319*  CRP 2.7* 1.1* 0.8    No results found for: SARSCOV2NAA  2. Elevated d-dimer  L>R LE Edema - D-dimer is elevated to 2.38 in ED  - There is no chest pain or hemoptysis, and no evidence for DVT, and this is likely secondary to the acute infectious illness  - Continue to treat pneumonia and trend  - follow LE [place] - no evidence of DVT  Hyperglycemia: follow A1c (7.1).  Start basal and bolus regimen.  SSI.  Will continue to adjust as needed.  Elevated ALT: continue to monitor, possibly related to COVID  Leukocytosis: 2/2 steroids, follow  DVT prophylaxis: lovenox Code Status: full  Family Communication: wife over phone 12/6 Disposition Plan: pending further improvement  Consultants:  none  Procedures:   none  Antimicrobials:  Anti-infectives (From admission, onward)   Start     Dose/Rate Route Frequency Ordered Stop   06/25/19 1000  remdesivir 100 mg in sodium chloride 0.9 % 100 mL IVPB     100 mg 200 mL/hr over 30 Minutes Intravenous Daily 06/24/19 2126 06/25/19 0937   06/22/19 1000  remdesivir 100 mg in sodium chloride 0.9 % 250 mL IVPB  Status:  Discontinued      100 mg 500 mL/hr over 30 Minutes Intravenous Every 24 hours 06/21/19 1956 06/24/19 2124   06/21/19 2100  cefTRIAXone (ROCEPHIN) 2 g in sodium chloride 0.9 % 100 mL IVPB     2 g 200 mL/hr over 30 Minutes Intravenous Every 24 hours 06/21/19 2047 06/25/19 2233   06/21/19 2100  azithromycin (ZITHROMAX) 500 mg in sodium chloride 0.9 % 250 mL IVPB     500 mg 250 mL/hr over 60 Minutes Intravenous Every 24 hours 06/21/19 2047 06/25/19 2100   06/21/19 2100  remdesivir 200 mg in sodium chloride 0.9 % 250 mL IVPB     200 mg 500 mL/hr over 30 Minutes Intravenous Once 06/21/19 1956 06/21/19 2211     Subjective: Feeling well, better today  Objective: Vitals:   06/27/19 0755 06/27/19 1238 06/27/19 1500 06/27/19 1807  BP: 139/79  130/84   Pulse: 88  93   Resp: 15  (!) 21   Temp: 97.6 F (36.4 C)  98.8 F (37.1 C)   TempSrc: Oral  Oral   SpO2: 94% 96% 100% 97%  Weight:      Height:        Intake/Output Summary (Last 24 hours) at 06/27/2019 1921 Last data filed at 06/27/2019 1855 Gross per 24 hour  Intake 1300 ml  Output 1475 ml  Net -175 ml   Filed Weights   06/25/19 0500 06/26/19 0500 06/27/19 0453  Weight: 114 kg 113.2 kg 111.6 kg    Examination:  General: No acute distress. Cardiovascular: RRR Lungs: unlabored Abdomen: Soft, nontender, nondistended Neurological: Alert and oriented 3. Moves all extremities 4. Cranial nerves II through XII grossly intact. Skin: Warm and dry. No rashes or lesions. Extremities: No clubbing or cyanosis. No edema.   Data Reviewed: I have personally reviewed following labs and imaging studies  CBC: Recent Labs  Lab 06/23/19 0018 06/24/19 0218 06/25/19 0116 06/26/19 0326 06/27/19 0130  WBC 14.3* 20.3* 22.0* 17.9* 18.9*  NEUTROABS 12.0* 17.7* 19.7* 15.8* 15.8*  HGB 15.2 15.5 15.9 15.7 16.3  HCT 46.0 46.8 47.8 46.2 48.5  MCV 92.2 90.9 90.5 90.2 90.5  PLT 280 368 415* 426* 455*   Basic Metabolic Panel: Recent Labs  Lab 06/22/19 0805  06/23/19 0018 06/24/19 0218 06/24/19 2028 06/25/19 0116 06/26/19 0326 06/27/19 0130  NA 138 138 138 136 138 139 140  K 4.5 4.4 4.3 4.7 4.3 4.5 3.6  CL 103 101 102 97* 99 98 95*  CO2 22 20* 23 22 24 26 29   GLUCOSE 172* 230* 222* 437* 286* 242* 110*  BUN 16 24* 29* 36* 36* 33* 39*  CREATININE 0.84 0.88 0.92 1.13 0.97 0.98 0.97  CALCIUM 8.4* 8.8* 8.7* 9.3 8.9 8.5* 8.8*  MG 2.0 2.2 2.1  --  2.2 2.3 2.3  PHOS 3.1 2.8 3.4  --  3.8 3.8  --    GFR: Estimated Creatinine Clearance: 94 mL/min (by C-G formula based on SCr of 0.97 mg/dL). Liver Function Tests: Recent Labs  Lab 06/23/19 0018 06/24/19 [id number]  06/25/19 0116 06/26/19 0326 06/27/19 0130  AST 43* 30 31 18 18   ALT 76* 77* 97* 70* 62*  ALKPHOS 61 65 68 62 64  BILITOT 0.4 0.5 0.5 0.8 0.8  PROT 6.9 6.8 7.0 6.6 6.7  ALBUMIN 3.0* 2.9* 3.1* 3.1* 3.0*   No results for input(s): LIPASE, AMYLASE in the last 168 hours. No results for input(s): AMMONIA in the last 168 hours. Coagulation Profile: No results for input(s): INR, PROTIME in the last 168 hours. Cardiac Enzymes: No results for input(s): CKTOTAL, CKMB, CKMBINDEX, TROPONINI in the last 168 hours. BNP (last 3 results) No results for input(s): PROBNP in the last 8760 hours. HbA1C: No results for input(s): HGBA1C in the last 72 hours. CBG: Recent Labs  Lab 06/26/19 2334 06/27/19 0404 06/27/19 0753 06/27/19 1156 06/27/19 1559  GLUCAP 195* 124* 107* 313* 356*   Lipid Profile: No results for input(s): CHOL, HDL, LDLCALC, TRIG, CHOLHDL, LDLDIRECT in the last 72 hours. Thyroid Function Tests: No results for input(s): TSH, T4TOTAL, FREET4, T3FREE, THYROIDAB in the last 72 hours. Anemia Panel: Recent Labs    06/26/19 0326 06/27/19 0130  FERRITIN 951* 1,319*   Sepsis Labs: Recent Labs  Lab 06/21/19 1721 06/22/19 1130 06/23/19 0018 06/24/19 0218  PROCALCITON 0.32 0.20 0.18 0.14  LATICACIDVEN 1.8  --   --   --     Recent Results (from the past 240 hour(s))   Blood Culture (routine x 2)     Status: None   Collection Time: 06/21/19  5:21 PM   Specimen: BLOOD LEFT FOREARM  Result Value Ref Range Status   Specimen Description   Final    BLOOD LEFT FOREARM Performed at [facility], [address], [place], [place]    Special Requests   Final    BOTTLES DRAWN AEROBIC AND ANAEROBIC Blood Culture adequate volume Performed at [facility], [address], [place], [place]    Culture   Final    NO GROWTH 5 DAYS Performed at [facility], [address], [place], [place]    Report Status 06/26/2019 FINAL  Final  Blood Culture (routine x 2)     Status: None   Collection Time: 06/21/19  5:21 PM   Specimen: BLOOD  Result Value Ref Range Status   Specimen Description   Final    BLOOD BLOOD LEFT HAND Performed at [facility], [address] [place], [place]    Special Requests   Final    BOTTLES DRAWN AEROBIC AND ANAEROBIC Blood Culture adequate volume Performed at [facility], [address], [place], [place]    Culture   Final    NO GROWTH 5 DAYS Performed at [facility], [address] [place], [place]    Report Status 06/26/2019 FINAL  Final  MRSA PCR Screening     Status: None   Collection Time: 06/26/19 10:22 AM   Specimen: Nasopharyngeal  Result Value Ref Range Status   MRSA by PCR NEGATIVE NEGATIVE Final    Comment:        The GeneXpert MRSA Assay (FDA approved for NASAL specimens only), is one component of Raelyn Racette comprehensive MRSA colonization surveillance program. It is not intended to diagnose MRSA infection nor to guide or monitor treatment for MRSA infections. Performed at [facility], [address] [place], [place]  Radiology Studies: Dg Chest Port 1 View  Result Date: 06/26/2019 CLINICAL DATA:  Hypoxia EXAM: PORTABLE CHEST 1  VIEW COMPARISON:  06/24/2019 FINDINGS: Cardiomediastinal contours are stable. Persistent interstitial and airspace opacities are seen in the chest without dense consolidation or evidence of pleural effusion. No acute bone finding. IMPRESSION: Stable appearance of the chest. Persistent interstitial and airspace opacities in the chest. No pleural effusion or pneumothorax is evident. Electronically Signed   By: Zetta Bills M.D.   On: 06/26/2019 15:21        Scheduled Meds: . dexamethasone (DECADRON) injection  6 mg Intravenous Q24H  . enoxaparin (LOVENOX) injection  0.5 mg/kg Subcutaneous Q24H  . famotidine  20 mg Oral BID  . insulin aspart  0-20 Units Subcutaneous Q4H  . insulin aspart  10 Units Subcutaneous TID WC  . insulin detemir  18 Units Subcutaneous BID  . mouth rinse  15 mL Mouth Rinse BID   Continuous Infusions:    LOS: 6 days    Time spent: over 30 min    Fayrene Helper, MD [facility] Pager AMION  If 7PM-7AM, please contact night-coverage [email or web address] Password [facility] 06/27/2019, 7:21 PM

## 2019-06-27 NOTE — Progress Notes (Addendum)
0745: Assumed care of Pt from night RN. Pt alert, denies pain, VSS, SpO2 94% on 2 L [place]. Denies needs at this time, instructed on how to use call bell for assistance, will monitor.   1143: Spouse updated over phone, All questions answered  1230: Pt resting comfortably in the chair. Denies needs at this time  1615: Reassessed, VSS, SpO2 100% on 1 L [place]. Denies needs at this time, will monitor  1855: Pt resting comfortably in chair, hopeful to be discharged tomorrow. Denies needs at this time

## 2019-06-28 LAB — FERRITIN: Ferritin: 1131 ng/mL — ABNORMAL HIGH (ref 24–336)

## 2019-06-28 LAB — COMPREHENSIVE METABOLIC PANEL
ALT: 53 U/L — ABNORMAL HIGH (ref 0–44)
AST: 19 U/L (ref 15–41)
Albumin: 3.1 g/dL — ABNORMAL LOW (ref 3.5–5.0)
Alkaline Phosphatase: 60 U/L (ref 38–126)
Anion gap: 10 (ref 5–15)
BUN: 33 mg/dL — ABNORMAL HIGH (ref 8–23)
CO2: 29 mmol/L (ref 22–32)
Calcium: 8.2 mg/dL — ABNORMAL LOW (ref 8.9–10.3)
Chloride: 95 mmol/L — ABNORMAL LOW (ref 98–111)
Creatinine, Ser: 0.83 mg/dL (ref 0.61–1.24)
GFR calc Af Amer: >60 mL/min (ref >60)
GFR calc non Af Amer: >60 mL/min (ref >60)
Glucose, Bld: 69 mg/dL — ABNORMAL LOW (ref 70–99)
Potassium: 3.8 mmol/L (ref 3.5–5.1)
Sodium: 134 mmol/L — ABNORMAL LOW (ref 135–145)
Total Bilirubin: 1.3 mg/dL — ABNORMAL HIGH (ref 0.3–1.2)
Total Protein: 6.3 g/dL — ABNORMAL LOW (ref 6.5–8.1)

## 2019-06-28 LAB — CBC WITH DIFFERENTIAL/PLATELET
Abs Immature Granulocytes: 0.3 10*3/uL — ABNORMAL HIGH (ref 0.00–0.07)
Basophils Absolute: 0.1 10*3/uL (ref 0.0–0.1)
Basophils Relative: 1 %
Eosinophils Absolute: 0.1 10*3/uL (ref 0.0–0.5)
Eosinophils Relative: 1 %
HCT: 47.5 % (ref 39.0–52.0)
Hemoglobin: 15.6 g/dL (ref 13.0–17.0)
Immature Granulocytes: 2 %
Lymphocytes Relative: 8 %
Lymphs Abs: 1.4 10*3/uL (ref 0.7–4.0)
MCH: 30.2 pg (ref 26.0–34.0)
MCHC: 32.8 g/dL (ref 30.0–36.0)
MCV: 92.1 fL (ref 80.0–100.0)
Monocytes Absolute: 1.6 10*3/uL — ABNORMAL HIGH (ref 0.1–1.0)
Monocytes Relative: 10 %
Neutro Abs: 13.9 10*3/uL — ABNORMAL HIGH (ref 1.7–7.7)
Neutrophils Relative %: 78 %
Platelets: 421 10*3/uL — ABNORMAL HIGH (ref 150–400)
RBC: 5.16 MIL/uL (ref 4.22–5.81)
RDW: 12.5 % (ref 11.5–15.5)
WBC: 17.3 10*3/uL — ABNORMAL HIGH (ref 4.0–10.5)
nRBC: 0 % (ref 0.0–0.2)

## 2019-06-28 LAB — MAGNESIUM: Magnesium: 2.4 mg/dL (ref 1.7–2.4)

## 2019-06-28 LAB — D-DIMER, QUANTITATIVE: D-Dimer, Quant: 1.54 ug/mL-FEU — ABNORMAL HIGH (ref 0.00–0.50)

## 2019-06-28 LAB — GLUCOSE, CAPILLARY
Glucose-Capillary: 198 mg/dL — ABNORMAL HIGH (ref 70–99)
Glucose-Capillary: 235 mg/dL — ABNORMAL HIGH (ref 70–99)
Glucose-Capillary: 88 mg/dL (ref 70–99)
Glucose-Capillary: 94 mg/dL (ref 70–99)

## 2019-06-28 LAB — C-REACTIVE PROTEIN: CRP: 0.6 mg/dL (ref <1.0)

## 2019-06-28 MED ORDER — METFORMIN HCL 500 MG PO TABS: 500.0000 mg | ORAL_TABLET | Freq: Two times a day (BID) | ORAL | 0 refills | Status: DC

## 2019-06-28 MED ORDER — LIVING WELL WITH DIABETES BOOK
Freq: Once | Status: AC
  Administered 2019-06-28: 15:00:00
  Filled 2019-06-28 (×2): qty 1

## 2019-06-28 MED ORDER — BLOOD GLUCOSE MONITOR KIT: PACK | 0 refills | Status: DC

## 2019-06-28 NOTE — Progress Notes (Signed)
SATURATION QUALIFICATIONS: (This note is used to comply with regulatory documentation for home oxygen)  Patient Saturations on Room Air at Rest = 93-91%  Patient Saturations on Room Air while Ambulating = 78%  Patient Saturations on 2 Liters of oxygen while Ambulating = 91%  Please briefly explain why patient needs home oxygen: In chair or bed 91-93%   Ambulating drops to 78-85% on O2  2L walking 88-91%

## 2019-06-28 NOTE — Progress Notes (Signed)
Physical Therapy Treatment Patient Details Name: Nicholas Marks MRN: [id number] DOB: [birth year] Today's Date: 06/28/2019    History of Present Illness 63 y.o. male admitted 06/21/19 with progressive SOB, fever and general malaise; worked up for COVID-19 PNA having initially tested (+) on 06/13/19. No PMH on file.   PT Comments    Pt progressing well with mobility. Preparing for d/c home this afternoon. Performing ambulation and ADL tasks throughout room independently, only requiring cues to take seated rest breaks and pursed lip breathing due to SpO2 down to low 80s on RA. Pt required 2L O2 [place] to maintain SpO2 >/88% (see RN's saturation qualifications note). Reinforced educ re: energy conservation, daily aerobic activity recommendations (self-monitor O2 with pulse ox), home O2 needs. Pt has no further questions or concerns, he is ready to get home.    Follow Up Recommendations  No PT follow up;Supervision for mobility/OOB(wife declined HHPT)     Equipment Recommendations  None recommended by PT    Recommendations for Other Services       Precautions / Restrictions Precautions Precautions: Fall Precaution Comments: desats w/ activity Restrictions Weight Bearing Restrictions: No    Mobility  Bed Mobility Overal bed mobility: Independent                Transfers Overall transfer level: Independent Equipment used: None Transfers: Sit to/from Stand              Ambulation/Gait Ambulation/Gait assistance: Independent Social research officer, government (Feet): 60 Feet Assistive device: None Gait Pattern/deviations: Step-through pattern;Decreased stride length   Gait velocity interpretation: 1.31 - 2.62 ft/sec, indicative of limited community ambulator General Gait Details: Ambulate around room preparing ADL tasks and dresiing in preparation to leave; intermittent seated rest breaks with SpO2 down to low 80s on RA. Pt requiring cues to sit/rest and pursed lip breathing with SpO2 slowly  returning to >/88% on RA. requires 2L to maintain >88% with activity   Stairs             Wheelchair Mobility    Modified Rankin (Stroke Patients Only)       Balance Overall balance assessment: Needs assistance   Sitting balance-Leahy Scale: Good       Standing balance-Leahy Scale: Good               High level balance activites: Side stepping;Backward walking;Direction changes;Turns;Sudden stops;Head turns High Level Balance Comments: No overt LOB or instability with higher level balance tasks; pt reliant on UE support to pick object off floor            Cognition Arousal/Alertness: Awake/alert Behavior During Therapy: [facility] for tasks assessed/performed Overall Cognitive Status: Within Functional Limits for tasks assessed                                        Exercises      General Comments General comments (skin integrity, edema, etc.): Pt preparing to d/c home. Reinforced educ re: energy conservation (handout provided), aerobic activity recommendations (rest breaks, monitor SpO2 with pulse ox), home O2 requirements      Pertinent Vitals/Pain Pain Assessment: No/denies pain    Home Living                      Prior Function            PT Goals (current goals can now be found in the  care plan section) Progress towards PT goals: Progressing toward goals    Frequency    Min 3X/week      PT Plan Discharge plan needs to be updated    Co-evaluation              AM-PAC PT "6 Clicks" Mobility   Outcome Measure  Help needed turning from your back to your side while in a flat bed without using bedrails?: None Help needed moving from lying on your back to sitting on the side of a flat bed without using bedrails?: None Help needed moving to and from a bed to a chair (including a wheelchair)?: None Help needed standing up from a chair using your arms (e.g., wheelchair or bedside chair)?: None Help needed to walk  in hospital room?: None Help needed climbing 3-5 steps with a railing? : A Little 6 Click Score: 23    End of Session   Activity Tolerance: Patient tolerated treatment well Patient left: with call bell/phone within reach(seated EOB) Nurse Communication: Mobility status PT Visit Diagnosis: Unsteadiness on feet (R26.81);Muscle weakness (generalized) (M62.81)     Time: 0814-4818 PT Time Calculation (min) (ACUTE ONLY): 24 min  Charges:  $Therapeutic Activity: 8-22 mins $Self Care/Home Management: 8-22                    Ina Homes, PT, DPT Acute Rehabilitation Services  Pager [phone] Office [phone]  Malachy Chamber 06/28/2019, 2:14 PM

## 2019-06-28 NOTE — Discharge Instructions (Signed)
Your quarantine will end on 12/14 as [place] as you continue to be asymptomatic without meds.  Call the health department with questions.     Person Under Monitoring Name: Nicholas Marks  [place] [address] [place] [id number]   Infection Prevention Recommendations for Individuals Confirmed to have, or Being Evaluated for, 2019 Novel Coronavirus (COVID-19) Infection Who Receive Care at Home  Individuals who are confirmed to have, or are being evaluated for, COVID-19 should follow the prevention steps below until Remie Mathison healthcare provider or local or state health department says they can return to normal activities.  Stay home except to get medical care You should restrict activities outside your home, except for getting medical care. Do not go to work, school, or public areas, and do not use public transportation or taxis.  Call ahead before visiting your doctor Before your medical appointment, call the healthcare provider and tell them that you have, or are being evaluated for, COVID-19 infection. This will help the healthcare providers office take steps to keep other people from getting infected. Ask your healthcare provider to call the local or state health department.  Monitor your symptoms Seek prompt medical attention if your illness is worsening (e.g., difficulty breathing). Before going to your medical appointment, call the healthcare provider and tell them that you have, or are being evaluated for, COVID-19 infection. Ask your healthcare provider to call the local or state health department.  Wear Blanch Stang facemask You should wear Aalijah Lanphere facemask that covers your nose and mouth when you are in the same room with other people and when you visit Mozel Burdett healthcare provider. People who live with or visit you should also wear Sadeel Fiddler facemask while they are in the same room with you.  Separate yourself from other people in your home As much as possible, you should stay in Tyreque Finken different room from  other people in your home. Also, you should use Tawonda Legaspi separate bathroom, if available.  Avoid sharing household items You should not share dishes, drinking glasses, cups, eating utensils, towels, bedding, or other items with other people in your home. After using these items, you should wash them thoroughly with soap and water.  Cover your coughs and sneezes Cover your mouth and nose with Tiaunna Buford tissue when you cough or sneeze, or you can cough or sneeze into your sleeve. Throw used tissues in Ivis Nicolson lined trash can, and immediately wash your hands with soap and water for at least 20 seconds or use an alcohol-based hand rub.  Wash your Union Pacific Corporationhands Wash your hands often and thoroughly with soap and water for at least 20 seconds. You can use an alcohol-based hand sanitizer if soap and water are not available and if your hands are not visibly dirty. Avoid touching your eyes, nose, and mouth with unwashed hands.   Prevention Steps for Caregivers and Household Members of Individuals Confirmed to have, or Being Evaluated for, COVID-19 Infection Being Cared for in the Home  If you live with, or provide care at home for, Tanna Loeffler person confirmed to have, or being evaluated for, COVID-19 infection please follow these guidelines to prevent infection:  Follow healthcare providers instructions Make sure that you understand and can help the patient follow any healthcare provider instructions for all care.  Provide for the patients basic needs You should help the patient with basic needs in the home and provide support for getting groceries, prescriptions, and other personal needs.  Monitor the patients symptoms If they are getting sicker, call his  or her medical provider and tell them that the patient has, or is being evaluated for, COVID-19 infection. This will help the healthcare providers office take steps to keep other people from getting infected. Ask the healthcare provider to call the local or state health  department.  Limit the number of people who have contact with the patient  If possible, have only one caregiver for the patient.  Other household members should stay in another home or place of residence. If this is not possible, they should stay  in another room, or be separated from the patient as much as possible. Use Ad Guttman separate bathroom, if available.  Restrict visitors who do not have an essential need to be in the home.  Keep older adults, very young children, and other sick people away from the patient Keep older adults, very young children, and those who have compromised immune systems or chronic health conditions away from the patient. This includes people with chronic heart, lung, or kidney conditions, diabetes, and cancer.  Ensure good ventilation Make sure that shared spaces in the home have good air flow, such as from an air conditioner or an opened window, weather permitting.  Wash your hands often  Wash your hands often and thoroughly with soap and water for at least 20 seconds. You can use an alcohol based hand sanitizer if soap and water are not available and if your hands are not visibly dirty.  Avoid touching your eyes, nose, and mouth with unwashed hands.  Use disposable paper towels to dry your hands. If not available, use dedicated cloth towels and replace them when they become wet.  Wear Rayaan Lorah facemask and gloves  Wear Kasee Hantz disposable facemask at all times in the room and gloves when you touch or have contact with the patients blood, body fluids, and/or secretions or excretions, such as sweat, saliva, sputum, nasal mucus, vomit, urine, or feces.  Ensure the mask fits over your nose and mouth tightly, and do not touch it during use.  Throw out disposable facemasks and gloves after using them. Do not reuse.  Wash your hands immediately after removing your facemask and gloves.  If your personal clothing becomes contaminated, carefully remove clothing and launder. Wash  your hands after handling contaminated clothing.  Place all used disposable facemasks, gloves, and other waste in Kirsta Probert lined container before disposing them with other household waste.  Remove gloves and wash your hands immediately after handling these items.  Do not share dishes, glasses, or other household items with the patient  Avoid sharing household items. You should not share dishes, drinking glasses, cups, eating utensils, towels, bedding, or other items with Brilee Port patient who is confirmed to have, or being evaluated for, COVID-19 infection.  After the person uses these items, you should wash them thoroughly with soap and water.  Wash laundry thoroughly  Immediately remove and wash clothes or bedding that have blood, body fluids, and/or secretions or excretions, such as sweat, saliva, sputum, nasal mucus, vomit, urine, or feces, on them.  Wear gloves when handling laundry from the patient.  Read and follow directions on labels of laundry or clothing items and detergent. In general, wash and dry with the warmest temperatures recommended on the label.  Clean all areas the individual has used often  Clean all touchable surfaces, such as counters, tabletops, doorknobs, bathroom fixtures, toilets, phones, keyboards, tablets, and bedside tables, every day. Also, clean any surfaces that may have blood, body fluids, and/or secretions or excretions on them.  Wear gloves when cleaning surfaces the patient has come in contact with.  Use Airis Barbee diluted bleach solution (e.g., dilute bleach with 1 part bleach and 10 parts water) or Rufus Beske household disinfectant with Demetrick Eichenberger label that says EPA-registered for coronaviruses. To make Clennon Nasca bleach solution at home, add 1 tablespoon of bleach to 1 quart (4 cups) of water. For Ileene Allie larger supply, add  cup of bleach to 1 gallon (16 cups) of water.  Read labels of cleaning products and follow recommendations provided on product labels. Labels contain instructions for safe and  effective use of the cleaning product including precautions you should take when applying the product, such as wearing gloves or eye protection and making sure you have good ventilation during use of the product.  Remove gloves and wash hands immediately after cleaning.  Monitor yourself for signs and symptoms of illness Caregivers and household members are considered close contacts, should monitor their health, and will be asked to limit movement outside of the home to the extent possible. Follow the monitoring steps for close contacts listed on the symptom monitoring form.   ? If you have additional questions, contact your local health department or call the epidemiologist on call at [phone] (available 24/7). ? This guidance is subject to change. For the most up-to-date guidance from [facility], please refer to their website: [email or web address]

## 2019-06-28 NOTE — Progress Notes (Addendum)
Inpatient Diabetes Program Recommendations  AACE/ADA: New Consensus Statement on Inpatient Glycemic Control (2015)  Target Ranges:  Prepandial:   less than 140 mg/dL      Peak postprandial:   less than 180 mg/dL (1-2 hours)      Critically ill patients:  140 - 180 mg/dL   Lab Results  Component Value Date   GLUCAP 198 (H) 06/27/2019   HGBA1C 7.1 (H) 06/22/2019    Review of Glycemic Control Results for SANTO, ZAHRADNIK (MRN [id number]) as of 06/28/2019 09:17  Ref. Range 06/27/2019 07:53 06/27/2019 11:56 06/27/2019 15:59 06/27/2019 19:53 06/27/2019 23:50  Glucose-Capillary Latest Ref Range: 70 - 99 mg/dL 107 (H) 313 (H) 356 (H) 364 (H) 198 (H)    Inpatient Diabetes Program Recommendations:   Change Novolog correction to tid 0-20 +hs 0-5 Ordered Living Well With Diabetes book and attempted to call pt to discuss new onset diabetes without answer. Sent message to nurse to check if patient has phone accessible to him. Will reattempt.  Thank you, Nani Gasser. Amos Micheals, RN, MSN, CDE  Diabetes Coordinator Inpatient Glycemic Control Team Team Pager [phone] (8am-5pm) 06/28/2019 9:21 AM

## 2019-06-28 NOTE — TOC Initial Note (Addendum)
Transition of Care [facility]) - Initial/Assessment Note    Patient Details  Name: Nicholas Marks MRN: [id number] Date of Birth: [birth year]  [facility] CM/SW Contact:    Ihor Gully, LCSW Phone Number: 06/28/2019, 1:38 PM  Clinical Narrative:                 Patient from home with spouse. Admitted for Pneumonia; COVID-19; acute hypoxic respiratory failure. At baseline he is independent.  Discussed PT evaluation with Mrs. Gervacio. She declined PT. She indicated that she has Covid and patient has Covid and she is not interested in PT services.  Home oxygen needs were discussed. Oxygen ordered through Learta Codding with [facility]. Advised that patient will discharge today. Mrs. Werber will pick patient up when he is ready. Mrs. Lichty states that patient sees the doctor at [facility] for years but she wants to set him up with her PCP, Dr. Etter Sjogren at [place]. Advised to set patient with PCP and if her PCP was not able to accept patient to call the number on the back of her card to find in network PCPs. Mrs. Ragle was agreeable and stated that she would schedule with PCP. Advised that PCP was needed for ongoing oxygen orders. Mrs. Siwek verbalized understanding.    Expected Discharge Plan: Home/Self Care Barriers to Discharge: No Barriers Identified   Patient Goals and CMS Choice Patient states their goals for this hospitalization and ongoing recovery are:: To get back to baseline   Choice offered to / list presented to : Spouse  Expected Discharge Plan and Services Expected Discharge Plan: Home/Self Care         Expected Discharge Date: 06/28/19               DME Arranged: Oxygen DME Agency: [place] [facility] Date DME Agency Contacted: 06/28/19 Time DME Agency Contacted: [phone] Representative spoke with at DME Agency: [facility]            Prior Living Arrangements/Services   Lives with:: Spouse Patient language and need for interpreter reviewed:: Yes        Need for  Family Participation in Patient Care: Yes (Comment) Care giver support system in place?: Yes (comment)   Criminal Activity/Legal Involvement Pertinent to Current Situation/Hospitalization: No - Comment as needed  Activities of Daily Living Home Assistive Devices/Equipment: None ADL Screening (condition at time of admission) Patient's cognitive ability adequate to safely complete daily activities?: Yes Is the patient deaf or have difficulty hearing?: No Does the patient have difficulty seeing, even when wearing glasses/contacts?: No Does the patient have difficulty concentrating, remembering, or making decisions?: No Patient able to express need for assistance with ADLs?: No Does the patient have difficulty dressing or bathing?: No Independently performs ADLs?: Yes (appropriate for developmental age) Does the patient have difficulty walking or climbing stairs?: No Weakness of Legs: None Weakness of Arms/Hands: None  Permission Sought/Granted Permission sought to share information with : Family Supports          Permission granted to share info w Relationship: Spouse, Mrs. Haywood     Emotional Assessment Appearance:: Appears stated age   Affect (typically observed): Appropriate Orientation: : Oriented to Self, Oriented to Place, Oriented to  Time, Oriented to Situation Alcohol / Substance Use: Not Applicable Psych Involvement: No (comment)  Admission diagnosis:  Pneumonia due to COVID-19 virus [U07.1, J12.89] Patient Active Problem List   Diagnosis Date Noted  . Pneumonia due to COVID-19 virus 06/21/2019  .  Acute respiratory failure with hypoxia [facility])    PCP:  Patient, No Pcp Per Pharmacy:   [id number] [place] Otto, [place] - [address] [place] [address] Phone: [phone] Fax: [phone]     Social Determinants of Health (SDOH) Interventions    Readmission Risk Interventions No flowsheet data found.

## 2019-06-28 NOTE — Discharge Summary (Signed)
Physician Discharge Summary  Nicholas Marks PRF:[id number] DOB: May 29, 1956 DOA: 06/21/2019  PCP: Patient, No Pcp Per  Admit date: 06/21/2019 Discharge date: 06/28/2019  Time spent: 40 minutes  Recommendations for Outpatient Follow-up:  1. Follow outpatient CBC/CMP 2. Follow CDC quarantine guidelines 3. Follow O2 needs after discharge 4. Follow with pulmonology outpatient 5. Follow diabetes outpatient    Discharge Diagnoses:  Principal Problem:   Pneumonia due to COVID-19 virus   Discharge Condition: stable  Diet recommendation: diabetic  Filed Weights   06/26/19 0500 06/27/19 0453 06/28/19 0341  Weight: 113.2 kg 111.6 kg 112.1 kg    History of present illness:  Nicholas Marks [name] 63 y.o.malewho denies any significant past medical history now presents emergency department for evaluation of progressive shortness of breath and low oxygen saturations at home. Patient reports that he developed upper respiratory symptoms, fevers, headache, and general malaise little over Nicholas Marks week ago, was found to be positive for COVID-19 (outpatient test from 06/13/2019 is posted in media tab in electronic medical record), started an antibiotic and steroid, has been taking acetaminophen at home, reports resolution of fevers and headache over the past couple days, but has had progressive dyspnea. Patient has home pulse oximeter and noted that his saturation was dipping into the mid to upper 70s with minimal exertion today. He has had Roxas Clymer cough that is productive of thick dark sputum, denies chest pain or leg swelling, denies leg tenderness, and denies any abdominal pain.  ED Course:Upon arrival to the ED, patient is found to be afebrile, saturating 78% on room air, tachypneic to 30, and with stable blood pressure. EKG features Jaren Kearn sinus rhythm and chest x-ray is concerning for focal infiltrate in the left lower lobe. Chemistry panel is notable for slight hyponatremia and mild elevation in transaminases.  CBC features Jenipher Havel leukocytosis to 13,500. Lactic acid is normal. D-dimer is elevated to 2.38. Blood cultures were collected in the emergency department and the patient was treated with Decadron and supplemental oxygen.  He was admitted for pneumonia 2/2 COVID 19.  Treated with steroids, remdesivir, and plasma.  He's improved, doing well on RA at this time, requiring supplemental O2 with activity.  Plan for discharge home today.  Declined HHPT.  Hospital Course:  1.Pneumonia; COVID-19; acute hypoxic respiratory failure -Currently on 1 L - hopefully will continue to improve - RA at rest and requires 2 L with activty - follow outpatient  - CXR on 12/1 concerning for focal infiltrate in LLL -> started on abx for possible superimposed bacterial pneumonia - CXR 12/2 with bilateral airspace disease with progression on L  - CXR 12/3 stable - S/p steroids, remdesivir (complete) - s/p plasma 12/2  - continue abx at this point, procalcitonin trending down - will complete 5 day course. - will hold off on actemra at this time given concern for possible bacterial pneumonia at presentation, though would consider this if he were to worsen.  Pt was notably taking steroids and abx prior to admission. - I/O, daily weights - continue lasix as tolerated - prone as able, OOB as able, IS - Daily inflammatory labs - orelatively stable on day of discharge - CXR 12/6 stable with persistent interstitial and airspace opacities  COVID-19 Labs  Recent Labs    06/26/19 0326 06/27/19 0130 06/28/19 0321  DDIMER 1.49* 1.49* 1.54*  FERRITIN 951* 1,319* 1,131*  CRP 1.1* 0.8 0.6    No results found for: [place]  2. Elevated d-dimer  L>R LE Edema - D-dimer  is elevated to 2.38 in ED  - There is no chest pain or hemoptysis, and no evidence for DVT, and this is likely secondary to the acute infectious illness  - Continue to treat pneumonia and trend - follow LE [place] - no evidence of DVT  Hyperglycemia:  follow A1c (7.1).  Start metformin at discharge  Elevated ALT: continue to monitor, possibly related to COVID  Leukocytosis: 2/2 steroids, follow  Procedures: LE [place] Summary: Right: There is no evidence of deep vein thrombosis in the lower extremity. No cystic structure found in the popliteal fossa. Left: There is no evidence of deep vein thrombosis in the lower extremity. No cystic structure found in the popliteal fossa.  Consultations:  none  Discharge Exam: Vitals:   06/28/19 0800 06/28/19 0849  BP:  (!) 143/83  Pulse:  86  Resp: 13 (!) 21  Temp:  98.4 F (36.9 C)  SpO2:  93%   Feels well, ready for d/c Discussed plan of care Discussed with wife  General: No acute distress. Cardiovascular: RRR Lungs: Clear to auscultation bilaterally Abdomen: Soft, nontender, nondistended  Neurological: Alert and oriented 3. Moves all extremities 4 . Cranial nerves II through XII grossly intact. Skin: Warm and dry. No rashes or lesions. Extremities: No clubbing or cyanosis. No edema.   Discharge Instructions   Discharge Instructions    Ambulatory referral to Pulmonology   Complete by: As directed    COVID 19 follow up   Call MD for:  difficulty breathing, headache or visual disturbances   Complete by: As directed    Call MD for:  extreme fatigue   Complete by: As directed    Call MD for:  hives   Complete by: As directed    Call MD for:  persistant dizziness or light-headedness   Complete by: As directed    Call MD for:  persistant nausea and vomiting   Complete by: As directed    Call MD for:  redness, tenderness, or signs of infection (pain, swelling, redness, odor or green/yellow discharge around incision site)   Complete by: As directed    Call MD for:  severe uncontrolled pain   Complete by: As directed    Call MD for:  temperature >100.4   Complete by: As directed    Diet - low sodium heart healthy   Complete by: As directed    Discharge instructions    Complete by: As directed    You were seen for COVID 19 pneumonia.  You improved with steroids and remdesivir.  You were also treated with antibiotics.  We'll plan to discharge you home with oxygen.  You should use this with activity and at night while you're sleeping.  You'll need to follow up with pulmonology as an outpatient.  Your quarantine will continue until 12/14.  If you are asymptomatic without using meds, you can discontinue quarantine at that time.  You have diabetes.  We'll start you on metformin.  You should follow up with your outpatient provider regarding further management.  I'll send you home with Donaldo Teegarden glucometer.  Check your fasting blood sugar daily and bring Alyssha Housh list for your PCP.  You should have Latica Hohmann follow up chest x ray as an outpatient.  Return for new, recurrent, or worsening symptoms.  Please ask your PCP to request records from this hospitalization so they know what was done and what the next steps will be.   Increase activity slowly   Complete by: As directed  MyChart COVID-19 home monitoring program   Complete by: Jun 28, 2019    Is the patient willing to use the [place] for home monitoring?: Yes   Temperature monitoring   Complete by: Jun 28, 2019    After how many days would you like to receive Christien Berthelot notification of this patient's flowsheet entries?: 1     Allergies as of 06/28/2019   No Known Allergies     Medication List    TAKE these medications   blood glucose meter kit and supplies Kit Dispense based on patient and insurance preference. Use up to four times daily as directed. (FOR ICD-9 250.00, 250.01).   loratadine-pseudoephedrine 10-240 MG 24 hr tablet Commonly known as: CLARITIN-D 24-hour Take 1 tablet by mouth daily as needed for allergies.   metFORMIN 500 MG tablet Commonly known as: Glucophage Take 1 tablet (500 mg total) by mouth 2 (two) times daily with Rogelio Waynick meal.   oxyCODONE-acetaminophen 5-325 MG tablet Commonly known as:  PERCOCET/ROXICET Take 1-2 tablets by mouth every 4 (four) hours as needed for severe pain.            Durable Medical Equipment  (From admission, onward)         Start     Ordered   06/28/19 1228  DME Oxygen  Once    Comments: SATURATION QUALIFICATIONS: (This note is used to comply with regulatory documentation for home oxygen)  Patient Saturations on Room Air at Rest = 93-91%  Patient Saturations on Room Air while Ambulating = 78%  Patient Saturations on 2 Liters of oxygen while Ambulating = 91%  Please briefly explain why patient needs home oxygen: In chair or bed 91-93%   Ambulating drops to 78-85% on O2  2L walking 88-91%  Question Answer Comment  Length of Need Lifetime   Mode or (Route) Nasal cannula   Liters per Minute 2   Frequency Continuous (stationary and portable oxygen unit needed)   Oxygen conserving device Yes   Oxygen delivery system Gas      06/28/19 1228         No Known Allergies    The results of significant diagnostics from this hospitalization (including imaging, microbiology, ancillary and laboratory) are listed below for reference.    Significant Diagnostic Studies: Dg Chest Port 1 View  Result Date: 06/26/2019 CLINICAL DATA:  Hypoxia EXAM: PORTABLE CHEST 1 VIEW COMPARISON:  06/24/2019 FINDINGS: Cardiomediastinal contours are stable. Persistent interstitial and airspace opacities are seen in the chest without dense consolidation or evidence of pleural effusion. No acute bone finding. IMPRESSION: Stable appearance of the chest. Persistent interstitial and airspace opacities in the chest. No pleural effusion or pneumothorax is evident. Electronically Signed   By: Zetta Bills M.D.   On: 06/26/2019 15:21   Dg Chest Port 1 View  Result Date: 06/24/2019 CLINICAL DATA:  Hypoxia. EXAM: PORTABLE CHEST 1 VIEW COMPARISON:  06/23/2019 FINDINGS: Lungs are adequately inflated with stable bilateral patchy hazy airspace opacification likely infection.  No effusion. Mild stable cardiomegaly. Remainder of the exam is unchanged. IMPRESSION: Stable hazy patchy bilateral airspace process likely infection. Electronically Signed   By: Marin Olp M.D.   On: 06/24/2019 08:55   Dg Chest Port 1 View  Result Date: 06/23/2019 CLINICAL DATA:  Short of breath. EXAM: PORTABLE CHEST 1 VIEW COMPARISON:  06/22/2019 FINDINGS: Cardiac enlargement. No pleural effusion identified. Left midlung and left base airspace disease is similar to previous exam. No new findings. IMPRESSION: 1. No change in  aeration to the left lung compared with previous exam. 2. Stable cardiac enlargement. Electronically Signed   By: Kerby Moors M.D.   On: 06/23/2019 09:25   Dg Chest Port 1 View  Result Date: 06/22/2019 CLINICAL DATA:  Hypoxia.  COVID-19 positive EXAM: PORTABLE CHEST 1 VIEW COMPARISON:  12 and 2020 FINDINGS: Progression of left mid lung airspace disease. Left lower lobe airspace disease remains. Probable mild right lower lobe airspace disease. No effusion. Cardiac enlargement. IMPRESSION: Bilateral airspace disease with progression on the left. Electronically Signed   By: Franchot Gallo M.D.   On: 06/22/2019 09:19   Dg Chest Port 1 View  Result Date: 06/21/2019 CLINICAL DATA:  COVID-19. Hypoxia. EXAM: PORTABLE CHEST 1 VIEW COMPARISON:  None. FINDINGS: The heart size and pulmonary vascularity are normal. There is Cheick Suhr hazy infiltrate at the left lung base seen through the left side of the heart. Slight peripheral haziness at the lung bases is most likely Alexes Menchaca overlying soft tissue. IMPRESSION: Focal infiltrate in the left lower lobe posterior medially. Electronically Signed   By: Lorriane Shire M.D.   On: 06/21/2019 18:53   Vas [place] Lower Extremity Venous (dvt)  Result Date: 06/25/2019  Lower Venous Study Indications: Elevated Ddimer.  Risk Factors: COVID 19 positive. Limitations: Poor ultrasound/tissue interface. Comparison Study: No prior studies. Performing Technologist: Oliver Hum RVT  Examination Guidelines: Richell Corker complete evaluation includes B-mode imaging, spectral Doppler, color Doppler, and power Doppler as needed of all accessible portions of each vessel. Bilateral testing is considered an integral part of Jakavion Bilodeau complete examination. Limited examinations for reoccurring indications may be performed as noted.  +---------+---------------+---------+-----------+----------+--------------+ RIGHT    CompressibilityPhasicitySpontaneityPropertiesThrombus Aging +---------+---------------+---------+-----------+----------+--------------+ CFV      Full           Yes      Yes                                 +---------+---------------+---------+-----------+----------+--------------+ SFJ      Full                                                        +---------+---------------+---------+-----------+----------+--------------+ FV Prox  Full                                                        +---------+---------------+---------+-----------+----------+--------------+ FV Mid   Full                                                        +---------+---------------+---------+-----------+----------+--------------+ FV DistalFull                                                        +---------+---------------+---------+-----------+----------+--------------+ PFV      Full                                                        +---------+---------------+---------+-----------+----------+--------------+  POP      Full           Yes      Yes                                 +---------+---------------+---------+-----------+----------+--------------+ PTV      Full                                                        +---------+---------------+---------+-----------+----------+--------------+ PERO     Full                                                        +---------+---------------+---------+-----------+----------+--------------+    +---------+---------------+---------+-----------+----------+--------------+ LEFT     CompressibilityPhasicitySpontaneityPropertiesThrombus Aging +---------+---------------+---------+-----------+----------+--------------+ CFV      Full           Yes      Yes                                 +---------+---------------+---------+-----------+----------+--------------+ SFJ      Full                                                        +---------+---------------+---------+-----------+----------+--------------+ FV Prox  Full                                                        +---------+---------------+---------+-----------+----------+--------------+ FV Mid   Full                                                        +---------+---------------+---------+-----------+----------+--------------+ FV DistalFull                                                        +---------+---------------+---------+-----------+----------+--------------+ PFV      Full                                                        +---------+---------------+---------+-----------+----------+--------------+ POP      Full           Yes      Yes                                 +---------+---------------+---------+-----------+----------+--------------+  PTV      Full                                                        +---------+---------------+---------+-----------+----------+--------------+ PERO     Full                                                        +---------+---------------+---------+-----------+----------+--------------+     Summary: Right: There is no evidence of deep vein thrombosis in the lower extremity. No cystic structure found in the popliteal fossa. Left: There is no evidence of deep vein thrombosis in the lower extremity. No cystic structure found in the popliteal fossa.  *See table(s) above for measurements and observations. Electronically signed by  Deitra Mayo MD on 06/25/2019 at 1:54:56 PM.    Final     Microbiology: Recent Results (from the past 240 hour(s))  Blood Culture (routine x 2)     Status: None   Collection Time: 06/21/19  5:21 PM   Specimen: BLOOD LEFT FOREARM  Result Value Ref Range Status   Specimen Description   Final    BLOOD LEFT FOREARM Performed at [facility], [place] [address], [place], [place] [address]    Special Requests   Final    BOTTLES DRAWN AEROBIC AND ANAEROBIC Blood Culture adequate volume Performed at [facility] [address] [place], [place] [address]    Culture   Final    NO GROWTH 5 DAYS Performed at [facility], [place] [address], [place], [place] [address]    Report Status 06/26/2019 FINAL  Final  Blood Culture (routine x 2)     Status: None   Collection Time: 06/21/19  5:21 PM   Specimen: BLOOD  Result Value Ref Range Status   Specimen Description   Final    BLOOD BLOOD LEFT HAND Performed at [facility] [address] [place], [place] [address]    Special Requests   Final    BOTTLES DRAWN AEROBIC AND ANAEROBIC Blood Culture adequate volume Performed at [facility] [address], [place], [place] [address]    Culture   Final    NO GROWTH 5 DAYS Performed at [facility], [place] [address], [place], [place] [address]    Report Status 06/26/2019 FINAL  Final  MRSA PCR Screening     Status: None   Collection Time: 06/26/19 10:22 AM   Specimen: Nasopharyngeal  Result Value Ref Range Status   MRSA by PCR NEGATIVE NEGATIVE Final    Comment:        The GeneXpert MRSA Assay (FDA approved for NASAL specimens only), is one component of Katti Pelle comprehensive MRSA colonization surveillance program. It is not intended to diagnose MRSA infection nor to guide or monitor treatment for MRSA infections. Performed at [facility], [place] [address], [place], [place] [address]       Labs: Basic Metabolic Panel: Recent Labs  Lab 06/22/19 0805 06/23/19 0018 06/24/19 0218 06/24/19 2028 06/25/19 0116 06/26/19 0326 06/27/19 0130 06/28/19 0321  NA 138 138 138 136 138 139 140 134*  K 4.5  4.4 4.3 4.7 4.3 4.5 3.6 3.8  CL 103 101 102 97* 99 98 95* 95*  CO2 22 20* _0 GLUCOSE 172* 230* 222* 437* 286* 242* 110* 69*  BUN 16 24* 29* 36* 36* 33* 39* 33*  CREATININE 0.84 0.88 0.92 1.13 0.97 0.98 0.97 0.83  CALCIUM 8.4* 8.8* 8.7* 9.3 8.9 8.5* 8.8* 8.2*  MG 2.0 2.2 2.1  --  2.2 2.3 2.3 2.4  PHOS 3.1 2.8 3.4  --  3.8 3.8  --   --    Liver Function Tests: Recent Labs  Lab 06/24/19 0218 06/25/19 0116 06/26/19 0326 06/27/19 0130 06/28/19 0321  AST _1 ALT 77* 97* 70* 62* 53*  ALKPHOS 65 68 62 64 60  BILITOT 0.5 0.5 0.8 0.8 1.3*  PROT 6.8 7.0 6.6 6.7 6.3*  ALBUMIN 2.9* 3.1* 3.1* 3.0* 3.1*   No results for input(s): LIPASE, AMYLASE in the last 168 hours. No results for input(s): AMMONIA in the last 168 hours. CBC: Recent Labs  Lab 06/24/19 0218 06/25/19 0116 06/26/19 0326 06/27/19 0130 06/28/19 0321  WBC 20.3* 22.0* 17.9* 18.9* 17.3*  NEUTROABS 17.7* 19.7* 15.8* 15.8* 13.9*  HGB 15.5 15.9 15.7 16.3 15.6  HCT 46.8 47.8 46.2 48.5 47.5  MCV 90.9 90.5 90.2 90.5 92.1  PLT 368 415* 426* 455* 421*   Cardiac Enzymes: No results for input(s): CKTOTAL, CKMB, CKMBINDEX, TROPONINI in the last 168 hours. BNP: BNP (last 3 results) Recent Labs    06/23/19 0932  BNP 48.6    ProBNP (last 3 results) No results for input(s): PROBNP in the last 8760 hours.  CBG: Recent Labs  Lab 06/27/19 1953 06/27/19 2350 06/28/19 0326 06/28/19 0808 06/28/19 1110  GLUCAP 364* 198* 94 88 235*       Signed:  Fayrene Helper MD.  [facility] 06/28/2019, 7:11 PM

## 2019-07-06 ENCOUNTER — Other Ambulatory Visit: Payer: Self-pay

## 2019-07-07 ENCOUNTER — Encounter: Payer: Self-pay | Admitting: Medical

## 2019-07-07 ENCOUNTER — Telehealth: Payer: Self-pay | Admitting: Medical

## 2019-07-07 ENCOUNTER — Ambulatory Visit: Payer: BC Managed Care – PPO | Admitting: Medical

## 2019-07-07 ENCOUNTER — Ambulatory Visit (HOSPITAL_BASED_OUTPATIENT_CLINIC_OR_DEPARTMENT_OTHER)
Admission: RE | Admit: 2019-07-07 | Discharge: 2019-07-07 | Disposition: A | Discharge status: Home / Self Care | Payer: BC Managed Care – PPO | Source: Ambulatory Visit | Attending: Medical | Admitting: Medical

## 2019-07-07 VITALS — BP 147/77 | HR 86 | Temp 98.4°F | Ht 68.5 in | Wt 258.2 lb

## 2019-07-07 DIAGNOSIS — J1282 Pneumonia due to coronavirus disease 2019: Secondary | ICD-10-CM

## 2019-07-07 DIAGNOSIS — R5383 Other fatigue: Secondary | ICD-10-CM | POA: Diagnosis not present

## 2019-07-07 DIAGNOSIS — R0600 Dyspnea, unspecified: Secondary | ICD-10-CM | POA: Diagnosis not present

## 2019-07-07 DIAGNOSIS — U071 COVID-19: Secondary | ICD-10-CM

## 2019-07-07 DIAGNOSIS — E119 Type 2 diabetes mellitus without complications: Secondary | ICD-10-CM

## 2019-07-07 DIAGNOSIS — Z23 Encounter for immunization: Secondary | ICD-10-CM | POA: Diagnosis not present

## 2019-07-07 DIAGNOSIS — Z8616 Personal history of COVID-19: Secondary | ICD-10-CM

## 2019-07-07 DIAGNOSIS — J1289 Other viral pneumonia: Secondary | ICD-10-CM

## 2019-07-07 DIAGNOSIS — Z8619 Personal history of other infectious and parasitic diseases: Secondary | ICD-10-CM

## 2019-07-07 LAB — COMPREHENSIVE METABOLIC PANEL
ALT: 31 U/L (ref 0–53)
AST: 21 U/L (ref 0–37)
Albumin: 3.7 g/dL (ref 3.5–5.2)
Alkaline Phosphatase: 55 U/L (ref 39–117)
BUN: 15 mg/dL (ref 6–23)
CO2: 28 mEq/L (ref 19–32)
Calcium: 9.2 mg/dL (ref 8.4–10.5)
Chloride: 104 mEq/L (ref 96–112)
Creatinine, Ser: 0.88 mg/dL (ref 0.40–1.50)
GFR: 87.17 mL/min (ref >60.00)
Glucose, Bld: 107 mg/dL — ABNORMAL HIGH (ref 70–99)
Potassium: 4.7 mEq/L (ref 3.5–5.1)
Sodium: 139 mEq/L (ref 135–145)
Total Bilirubin: 0.5 mg/dL (ref 0.2–1.2)
Total Protein: 6.9 g/dL (ref 6.0–8.3)

## 2019-07-07 LAB — LIPID PANEL
Cholesterol: 190 mg/dL (ref 0–200)
HDL: 37.1 mg/dL — ABNORMAL LOW (ref >39.00)
LDL Cholesterol: 117 mg/dL — ABNORMAL HIGH (ref 0–99)
NonHDL: 152.78
Total CHOL/HDL Ratio: 5
Triglycerides: 178 mg/dL — ABNORMAL HIGH (ref 0.0–149.0)
VLDL: 35.6 mg/dL (ref 0.0–40.0)

## 2019-07-07 LAB — CBC WITH DIFFERENTIAL/PLATELET
Basophils Absolute: 0.1 10*3/uL (ref 0.0–0.1)
Basophils Relative: 0.9 % (ref 0.0–3.0)
Eosinophils Absolute: 0.2 10*3/uL (ref 0.0–0.7)
Eosinophils Relative: 2.2 % (ref 0.0–5.0)
HCT: 43.8 % (ref 39.0–52.0)
Hemoglobin: 14.6 g/dL (ref 13.0–17.0)
Lymphocytes Relative: 21.1 % (ref 12.0–46.0)
Lymphs Abs: 1.6 10*3/uL (ref 0.7–4.0)
MCHC: 33.4 g/dL (ref 30.0–36.0)
MCV: 92.4 fl (ref 78.0–100.0)
Monocytes Absolute: 0.7 10*3/uL (ref 0.1–1.0)
Monocytes Relative: 8.7 % (ref 3.0–12.0)
Neutro Abs: 5.2 10*3/uL (ref 1.4–7.7)
Neutrophils Relative %: 67.1 % (ref 43.0–77.0)
Platelets: 153 10*3/uL (ref 150.0–400.0)
RBC: 4.74 Mil/uL (ref 4.22–5.81)
RDW: 13.7 % (ref 11.5–15.5)
WBC: 7.7 10*3/uL (ref 4.0–10.5)

## 2019-07-07 MED ORDER — LOSARTAN POTASSIUM 25 MG PO TABS: 25.0000 mg | ORAL_TABLET | Freq: Every day | ORAL | 3 refills | Status: DC

## 2019-07-07 MED ORDER — ATORVASTATIN CALCIUM 10 MG PO TABS: 10.0000 mg | ORAL_TABLET | Freq: Every day | ORAL | 3 refills | Status: DC

## 2019-07-07 MED ORDER — METFORMIN HCL 500 MG PO TABS: 500.0000 mg | ORAL_TABLET | Freq: Two times a day (BID) | ORAL | 2 refills | Status: DC

## 2019-07-07 MED ORDER — DOXYCYCLINE HYCLATE 100 MG PO TABS: 100.0000 mg | ORAL_TABLET | Freq: Two times a day (BID) | ORAL | 0 refills | Status: DC

## 2019-07-07 NOTE — Patient Instructions (Addendum)
For history of covid pneumonia and dyspnea on exertionso will  get cxr and continue o2.  Will get cbc and cmp post hospitalization.   For diabetes continue metformin, low sugar diet and check sugars daily. Please get [place] name of glucometer insurance covers or maybe ask insurance if they would give you free machine.  For bp will likely rx low dose losartan for kidney protection effect.  Will check lipid panel today. Will likely soon rx low dose statin.  Referral to pulmonologist as hospital recommended on DC  Follow up in 2 weeks or as needed

## 2019-07-07 NOTE — Progress Notes (Signed)
Subjective:    Patient ID: Nicholas Marks, male    DOB: February 13, 1956, 63 y.o.   MRN: [id number]  HPI   Pt in for first time.   Pt works for [employer] No regular exercise before got covid. Plans to eat healthy. Non smoker. Rare alcohol use.      He updates me that he did test positive for covid June 15, 2019. Pt was admitted to hospital and was discharged on 06/28/2019. He tested negative on 07/04/2019. a1c 2 weeks ago was 7.1.   History of present illness:  Nicholas Marks a 63 y.o.malewho denies any significant past medical history now presents emergency department for evaluation of progressive shortness of breath and low oxygen saturations at home. Patient reports that he developed upper respiratory symptoms, fevers, headache, and general malaise little over a week ago, was found to be positive for COVID-19 (outpatient test from 06/13/2019 is posted in media tab in electronic medical record), started an antibiotic and steroid, has been taking acetaminophen at home, reports resolution of fevers and headache over the past couple days, but has had progressive dyspnea. Patient has home pulse oximeter and noted that his saturation was dipping into the mid to upper 70s with minimal exertion today. He has had a cough that is productive of thick dark sputum, denies chest pain or leg swelling, denies leg tenderness, and denies any abdominal pain.  ED Course:Upon arrival to the ED, patient is found to be afebrile, saturating 78% on room air, tachypneic to 30, and with stable blood pressure. EKG features a sinus rhythm and chest x-ray is concerning for focal infiltrate in the left lower lobe. Chemistry panel is notable for slight hyponatremia and mild elevation in transaminases. CBC features a leukocytosis to 13,500. Lactic acid is normal. D-dimer is elevated to 2.38. Blood cultures were collected in the emergency department and the patient was treated with Decadron and  supplemental oxygen.  He was admitted for pneumonia 2/2 COVID 19.  Treated with steroids, remdesivir, and plasma.  He's improved, doing well on RA at this time, requiring supplemental O2 with activity.  Plan for discharge home today.  Declined HHPT.  Hospital Course:  1.Pneumonia; COVID-19; acute hypoxic respiratory failure -Currently on 1 L - hopefully will continue to improve - RA at rest and requires 2 L with activty - follow outpatient  - CXR on 12/1 concerning for focal infiltrate in LLL -> started on abx for possible superimposed bacterial pneumonia - CXR 12/2 with bilateral airspace disease with progression on L  - CXR 12/3 stable - S/p steroids, remdesivir (complete) - s/p plasma 12/2  - continue abx at this point, procalcitonin trending down - will complete 5 day course. - will hold off on actemra at this time given concern for possible bacterial pneumonia at presentation, though would consider this if he were to worsen. Pt was notably taking steroids and abx prior to admission. - I/O, daily weights - continue lasix as tolerated - prone as able, OOB as able, IS - Daily inflammatory labs - orelatively stable on day of discharge - CXR 12/6 stable with persistent interstitial and airspace opacities  COVID-19 Labs  Recent Labs (last 2 labs)        Recent Labs    06/26/19 0326 06/27/19 0130 06/28/19 0321  DDIMER 1.49* 1.49* 1.54*  FERRITIN 951* 1,319* 1,131*  CRP 1.1* 0.8 0.6      Recent Labs  No results found for: [place]    2. Elevated d-dimer  L>R LE Edema - D-dimer is elevated to 2.38 in ED  - There is no chest pain or hemoptysis, and no evidence for DVT, and this is likely secondary to the acute infectious illness  - Continue to treat pneumonia and trend - follow LE [place]- no evidence of DVT  Hyperglycemia: follow A1c (7.1). Start metformin at discharge  Elevated ALT: continue to monitor, possibly related to COVID  Leukocytosis: 2/2 steroids,  follow  Procedures: LE [place] Summary: Right: There is no evidence of deep vein thrombosis in the lower extremity. No cystic structure found in the popliteal fossa. Left: There is no evidence of deep vein thrombosis in the lower extremity. No cystic structure found in the popliteal fossa.        Pt was diagnosed with diabetes when admitted in hospital. Pt wants to not have to take medications if possible. He is willing to take metformin presently.  Pt bp is borderline today.   No known high cholesterol.   Pt does not smoke. Stopped years ago.    Review of Systems  Constitutional: Negative for chills, fatigue and fever.  HENT: Negative for congestion.   Respiratory: Negative for cough, chest tightness, shortness of breath and wheezing.        Occasional mild sob. He has 02 sat 97% today.   Cardiovascular: Negative for chest pain and palpitations.  Gastrointestinal: Negative for abdominal pain, diarrhea and nausea.  Musculoskeletal: Negative for back pain.  Skin: Negative for rash.  Neurological: Negative for dizziness, speech difficulty, weakness, light-headedness and headaches.  Hematological: Negative for adenopathy. Does not bruise/bleed easily.  Psychiatric/Behavioral: Negative for behavioral problems and confusion.   Past Medical History:  Diagnosis Date   Kidney stone      Social History   Socioeconomic History   Marital status: Married    Spouse name: Not on file   Number of children: Not on file   Years of education: Not on file   Highest education level: Not on file  Occupational History   Not on file  Tobacco Use   Smoking status: Former Smoker    Types: Cigarettes    Quit date: 06/21/2004    Years since quitting: 15.0   Smokeless tobacco: Never Used  Substance and Sexual Activity   Alcohol use: Yes    Alcohol/week: 2.0 standard drinks    Types: 2 Shots of liquor per week    Comment: socially might go months   Drug use: Never   Sexual  activity: Not on file  Other Topics Concern   Not on file  Social History Narrative   Not on file   Social Determinants of Health   Financial Resource Strain:    Difficulty of Paying Living Expenses: Not on file  Food Insecurity:    Worried About [place] in the Last Year: Not on file   Ran Out of Food in the Last Year: Not on file  Transportation Needs:    Lack of Transportation (Medical): Not on file   Lack of Transportation (Non-Medical): Not on file  Physical Activity:    Days of Exercise per Week: Not on file   Minutes of Exercise per Session: Not on file  Stress:    Feeling of Stress : Not on file  Social Connections:    Frequency of Communication with Friends and Family: Not on file   Frequency of Social Gatherings with Friends and Family: Not on file   Attends Religious Services: Not on file  Active Member of Clubs or Organizations: Not on file   Attends Archivist Meetings: Not on file   Marital Status: Not on file  Intimate Partner Violence:    Fear of Current or Ex-Partner: Not on file   Emotionally Abused: Not on file   Physically Abused: Not on file   Sexually Abused: Not on file    Past Surgical History:  Procedure Laterality Date   APPENDECTOMY      No family history on file.  No Known Allergies  Current Outpatient Medications on File Prior to Visit  Medication Sig Dispense Refill   blood glucose meter kit and supplies KIT Dispense based on patient and insurance preference. Use up to four times daily as directed. (FOR ICD-9 250.00, 250.01). 1 each 0   loratadine-pseudoephedrine (CLARITIN-D 24-HOUR) 10-240 MG 24 hr tablet Take 1 tablet by mouth daily as needed for allergies.     metFORMIN (GLUCOPHAGE) 500 MG tablet Take 1 tablet (500 mg total) by mouth 2 (two) times daily with a meal. 60 tablet 0   No current facility-administered medications on file prior to visit.    BP (!) 147/77 (BP Location: Left  Arm, Cuff Size: Normal)    Pulse 86    Temp 98.4 F (36.9 C) (Temporal)    Ht 5' 8.5" (1.74 m)    Wt 258 lb 3.2 oz (117.1 kg)    SpO2 97%    BMI 38.69 kg/m       Objective:   Physical Exam  General Mental Status- Alert. General Appearance- Not in acute distress.   Skin General: Color- Normal Color. Moisture- Normal Moisture.  Neck Carotid Arteries- Normal color. Moisture- Normal Moisture. No carotid bruits. No JVD.  Chest and Lung Exam Auscultation: Breath Sounds:-Normal.  Cardiovascular Auscultation:Rythm- Regular. Murmurs & Other Heart Sounds:Auscultation of the heart reveals- No Murmurs.  Abdomen Inspection:-Inspeection Normal. Palpation/Percussion:Note:No mass. Palpation and Percussion of the abdomen reveal- Non Tender, Non Distended + BS, no rebound or guarding.   Neurologic Cranial Nerve exam:- CN III-XII intact(No nystagmus), symmetric smile. Strength:- 5/5 equal and symmetric strength both upper and lower extremities.  Lower ext- faint 1+ pedal edema but symmetric. Negative homans signs,      Assessment & Plan:  For history of covid pneumonia and dyspnea on exertionso will  get cxr and continue o2.  Will get cbc and cmp post hospitalization.   For diabetes continue metformin, low sugar diet and check sugars daily. Please get [place] name of glucometer insurance covers or maybe ask insurance if they would give you free machine.  For bp will likely rx low dose losartan for kidney protection effect.  Will check lipid panel today. Will likely soon rx low dose statin.  Referral to pulmonologist as hospital recommended on DC  Follow up in 2 weeks or as needed  45 minutes spent with pt. New pt with first visit hospital follow up for covid. 50% of time spent counseling pt on plan going forward as well as answering questions.

## 2019-07-07 NOTE — Telephone Encounter (Signed)
Rx losartan and lipitor sent to pt pharmacy.

## 2019-07-07 NOTE — Telephone Encounter (Signed)
rx doxycycline sent to pt pharmacy.

## 2019-07-07 NOTE — Telephone Encounter (Signed)
The blood sugar testing device my wife has is "One Touch Verio". Thanks, Ananias Pilgrim.  Will you send in rx for one touch verio. Along with supplies. Check sugar 3 times a day.

## 2019-07-11 ENCOUNTER — Encounter: Payer: Self-pay | Admitting: Medical

## 2019-07-11 NOTE — Telephone Encounter (Signed)
How often would you like for patient to check.  I am not sure if insurance will cover for 3 times a day as patient requests?

## 2019-07-11 NOTE — Telephone Encounter (Signed)
Would your write sig/supplies to check sugar at tid.Will see if covered.

## 2019-07-12 MED ORDER — ONETOUCH VERIO W/DEVICE KIT: PACK | 0 refills | Status: DC

## 2019-07-12 MED ORDER — ONETOUCH DELICA LANCETS 33G MISC: 1 refills | Status: DC

## 2019-07-12 MED ORDER — ONETOUCH VERIO VI STRP: ORAL_STRIP | 1 refills | Status: DC

## 2019-07-12 NOTE — Telephone Encounter (Signed)
Patient wife notified that rxs have been sent in.

## 2019-07-12 NOTE — Telephone Encounter (Signed)
Just spoke with wife on phone that they were just sent in.

## 2019-07-12 NOTE — Addendum Note (Signed)
Addended by: Kem Boroughs D on: 07/12/2019 11:48 AM   Modules accepted: Orders

## 2019-07-21 ENCOUNTER — Ambulatory Visit (INDEPENDENT_AMBULATORY_CARE_PROVIDER_SITE_OTHER): Payer: BC Managed Care – PPO | Admitting: Medical

## 2019-07-21 ENCOUNTER — Encounter: Payer: Self-pay | Admitting: Medical

## 2019-07-21 ENCOUNTER — Other Ambulatory Visit: Payer: Self-pay

## 2019-07-21 VITALS — BP 129/75 | HR 91 | Ht 68.0 in | Wt 254.0 lb

## 2019-07-21 DIAGNOSIS — R0600 Dyspnea, unspecified: Secondary | ICD-10-CM

## 2019-07-21 DIAGNOSIS — Z8619 Personal history of other infectious and parasitic diseases: Secondary | ICD-10-CM

## 2019-07-21 DIAGNOSIS — J1289 Other viral pneumonia: Secondary | ICD-10-CM

## 2019-07-21 DIAGNOSIS — J1282 Pneumonia due to coronavirus disease 2019: Secondary | ICD-10-CM

## 2019-07-21 DIAGNOSIS — E119 Type 2 diabetes mellitus without complications: Secondary | ICD-10-CM

## 2019-07-21 DIAGNOSIS — Z8616 Personal history of COVID-19: Secondary | ICD-10-CM

## 2019-07-21 DIAGNOSIS — U071 COVID-19: Secondary | ICD-10-CM

## 2019-07-21 NOTE — Patient Instructions (Addendum)
For your diabetes, continue low sugar diet and metformin. Recent bs level in good range.  For htn continue losartan. Bp well controlled. This medication protects kidneys as well.  For pnuemonia with covid you took doxycycline antibiotic. If you get chest congestion, productive cough or fever let me know.  For dyspnea post covid this can be normal. O2 sat at rest good. You can try mild exercise and see if 02 sat maintain at 95%. If not then recommend not exercising. Can continue 02 at home if needed. Follow up with pulmonologist early January.  Follow up in one month or as needed

## 2019-07-21 NOTE — Progress Notes (Signed)
   Subjective:    Patient ID: Nicholas Marks, male    DOB: 1955-09-19, 63 y.o.   MRN: [id number]  HPI  Virtual Visit via Telephone Note  I connected with Jaymes Graff Fiorella on 07/21/19 at  8:00 AM EST by telephone and verified that I am speaking with the correct person using two identifiers.  Location: Patient:  Work. Provider: office.   I discussed the limitations, risks, security and privacy concerns of performing an evaluation and management service by telephone and the availability of in person appointments. I also discussed with the patient that there may be a patient responsible charge related to this service. The patient expressed understanding and agreed to proceed.   History of Present Illness:  Pt sugars have been mostly 90-103 mostly.  Pt is on metformin.  Pt bp controlled today. He is on losartan.  Post hospitalization for covid. Xray did show some persisting pneumonia. Just finished the doxycycline. No fever, no chills or sweats. No wheezing.  Pt has oxygen at home. Has been using at home. Pt oxygen has been 95% at home at rest. When walking at home he has not checked his 02 %.      Observations/Objective: General- no acute distress. Pleasant. Alert and oriented. Normal speech.  Assessment and Plan: For your diabetes, continue low sugar diet and metformin. Recent bs level in good range.  For htn continue losartan. Bp well controlled. This medication protects kidneys as well.  For pnuemonia with covid you took doxycycline antibiotic. If you get chest congestion, productive cough or fever let me know.  For dyspnea post covid this can be normal. O2 sat at rest good. You can try mild exercise and see if 02 sat maintain at 95%. If not then recommend not exercising. Can continue 02 at home if needed. Follow up with pulmonologist early January.  Follow up in one month or as needed  Mackie Pai, PA-C  Follow Up Instructions:    I discussed the assessment and  treatment plan with the patient. The patient was provided an opportunity to ask questions and all were answered. The patient agreed with the plan and demonstrated an understanding of the instructions.   The patient was advised to call back or seek an in-person evaluation if the symptoms worsen or if the condition fails to improve as anticipated.  I provided 25 minutes of non-face-to-face time during this encounter.   Mackie Pai, PA-C   Review of Systems  Constitutional: Negative for appetite change, chills, fatigue and fever.  Respiratory: Negative for cough, choking and wheezing.        No obvious sob. Maybe some at end of expiration per pt. Also thinks some on amublating but not severe.  Cardiovascular: Negative for chest pain and palpitations.  Gastrointestinal: Negative for abdominal pain, diarrhea and nausea.  Genitourinary: Negative for dysuria.  Musculoskeletal: Negative for back pain.  Skin: Negative for rash.  Neurological: Negative for dizziness, weakness and headaches.  Hematological: Negative for adenopathy. Does not bruise/bleed easily.  Psychiatric/Behavioral: Negative for behavioral problems, decreased concentration and dysphoric mood.       Objective:   Physical Exam        Assessment & Plan:

## 2019-07-26 ENCOUNTER — Other Ambulatory Visit: Payer: Self-pay

## 2019-07-29 ENCOUNTER — Institutional Professional Consult (permissible substitution): Payer: BC Managed Care – PPO | Admitting: Pulmonary Disease

## 2019-08-05 ENCOUNTER — Encounter: Payer: Self-pay | Admitting: Pulmonary Disease

## 2019-08-05 ENCOUNTER — Ambulatory Visit: Payer: BC Managed Care – PPO | Admitting: Pulmonary Disease

## 2019-08-05 ENCOUNTER — Other Ambulatory Visit: Payer: Self-pay

## 2019-08-05 VITALS — BP 120/74 | HR 84 | Temp 98.3°F | Ht 68.5 in | Wt 263.0 lb

## 2019-08-05 DIAGNOSIS — R0602 Shortness of breath: Secondary | ICD-10-CM

## 2019-08-05 DIAGNOSIS — Z8616 Personal history of COVID-19: Secondary | ICD-10-CM | POA: Diagnosis not present

## 2019-08-05 NOTE — Progress Notes (Signed)
Nicholas Marks    [id number]    January 31, 1956  Primary Care Physician:Saguier, Iris Pert  Referring Physician: Elodia Florence., MD [address] [place],  [place] [address]  Chief complaint: Consult for post COVID-19 pneumonia  HPI: 64 year old with no significant past medical history admitted to [facility] from 12/[facility]-12/8 with COVID-19 pneumonia.  Treated with antibiotics for right lower lobe infiltrate.  He also received steroids, remdesivir and convalescent plasma.  Post discharge he states that his breathing is improved however not back to baseline.  He still is unable to take a deep breath and has dyspnea on exertion.  Pets: Has a dog.  No cats, birds, farm animal Occupation: Financial controller of [employer] Exposures: No known exposures.  No mold, hot tub, junk Smoking history: 30-pack-year smoker.  Quit in 2000 Travel history: No significant travel history Relevant family history: No significant family history of lung disease  Outpatient Encounter Medications as of 08/05/2019  Medication Sig  . atorvastatin (LIPITOR) 10 MG tablet Take 1 tablet (10 mg total) by mouth daily.  . blood glucose meter kit and supplies KIT Dispense based on patient and insurance preference. Use up to four times daily as directed. (FOR ICD-9 250.00, 250.01).  . Blood Glucose Monitoring Suppl (ONETOUCH VERIO) w/Device KIT Use as directed to check sugar 3 times a day.  Dx Code: E11.9  . glucose blood (ONETOUCH VERIO) test strip Use as instructed  . loratadine-pseudoephedrine (CLARITIN-D 24-HOUR) 10-240 MG 24 hr tablet Take 1 tablet by mouth daily as needed for allergies.  Marland Kitchen losartan (COZAAR) 25 MG tablet Take 1 tablet (25 mg total) by mouth daily.  . metFORMIN (GLUCOPHAGE) 500 MG tablet Take 1 tablet (500 mg total) by mouth 2 (two) times daily with a meal.  . OneTouch Delica Lancets 62H MISC Use as directed 3 times a day.  Dx Code E11.9  . [DISCONTINUED] doxycycline  (VIBRA-TABS) 100 MG tablet Take 1 tablet (100 mg total) by mouth 2 (two) times daily. Can give caps or generic.   No facility-administered encounter medications on file as of 08/05/2019.    Allergies as of 08/05/2019  . (No Known Allergies)    Past Medical History:  Diagnosis Date  . Kidney stone     Past Surgical History:  Procedure Laterality Date  . APPENDECTOMY      History reviewed. No pertinent family history.  Social History   Socioeconomic History  . Marital status: Married    Spouse name: Not on file  . Number of children: Not on file  . Years of education: Not on file  . Highest education level: Not on file  Occupational History  . Not on file  Tobacco Use  . Smoking status: Former Smoker    Packs/day: 1.50    Years: 25.00    Pack years: 37.50    Types: Cigarettes    Quit date: 06/21/2004    Years since quitting: 15.1  . Smokeless tobacco: Never Used  Substance and Sexual Activity  . Alcohol use: Yes    Alcohol/week: 2.0 standard drinks    Types: 2 Shots of liquor per week    Comment: socially might go months  . Drug use: Never  . Sexual activity: Not on file  Other Topics Concern  . Not on file  Social History Narrative  . Not on file   Social Determinants of Health   Financial Resource Strain:   .  Difficulty of Paying Living Expenses: Not on file  Food Insecurity:   . Worried About Charity fundraiser in the Last Year: Not on file  . Ran Out of Food in the Last Year: Not on file  Transportation Needs:   . Lack of Transportation (Medical): Not on file  . Lack of Transportation (Non-Medical): Not on file  Physical Activity:   . Days of Exercise per Week: Not on file  . Minutes of Exercise per Session: Not on file  Stress:   . Feeling of Stress : Not on file  Social Connections:   . Frequency of Communication with Friends and Family: Not on file  . Frequency of Social Gatherings with Friends and Family: Not on file  . Attends Religious  Services: Not on file  . Active Member of Clubs or Organizations: Not on file  . Attends Archivist Meetings: Not on file  . Marital Status: Not on file  Intimate Partner Violence:   . Fear of Current or Ex-Partner: Not on file  . Emotionally Abused: Not on file  . Physically Abused: Not on file  . Sexually Abused: Not on file    Review of systems: Review of Systems  Constitutional: Negative for fever and chills.  HENT: Negative.   Eyes: Negative for blurred vision.  Respiratory: as per HPI  Cardiovascular: Negative for chest pain and palpitations.  Gastrointestinal: Negative for vomiting, diarrhea, blood per rectum. Genitourinary: Negative for dysuria, urgency, frequency and hematuria.  Musculoskeletal: Negative for myalgias, back pain and joint pain.  Skin: Negative for itching and rash.  Neurological: Negative for dizziness, tremors, focal weakness, seizures and loss of consciousness.  Endo/Heme/Allergies: Negative for environmental allergies.  Psychiatric/Behavioral: Negative for depression, suicidal ideas and hallucinations.  All other systems reviewed and are negative.  Physical Exam: Blood pressure 120/74, pulse 84, temperature 98.3 F (36.8 C), temperature source Temporal, height 5' 8.5" (1.74 m), weight 263 lb (119.3 kg), SpO2 96 %. Gen:      No acute distress HEENT:  EOMI, sclera anicteric Neck:     No masses; no thyromegaly Lungs:    Clear to auscultation bilaterally; normal respiratory effort CV:         Regular rate and rhythm; no murmurs Abd:      + bowel sounds; soft, non-tender; no palpable masses, no distension Ext:    No edema; adequate peripheral perfusion Skin:      Warm and dry; no rash Neuro: alert and oriented x 3 Psych: normal mood and affect  Data Reviewed: Imaging: Chest x-ray 06/26/2019-persistent bilateral airspace opacities Chest x-ray 07/07/2019-persistent patchy airspace opacities greater on the right I have reviewed the images  personally.  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  Marshell Garfinkel MD [facility] 08/05/2019, 10:41 AM  CC: Elodia Florence.,*

## 2019-08-05 NOTE — Patient Instructions (Signed)
We will schedule you for high-resolution CT, pulmonary function test and 6-minute walk test  Follow-up in 4 weeks.

## 2019-08-11 ENCOUNTER — Other Ambulatory Visit: Payer: Self-pay

## 2019-08-11 ENCOUNTER — Ambulatory Visit (INDEPENDENT_AMBULATORY_CARE_PROVIDER_SITE_OTHER)
Admission: RE | Admit: 2019-08-11 | Discharge: 2019-08-11 | Disposition: A | Discharge status: Home / Self Care | Payer: BC Managed Care – PPO | Source: Ambulatory Visit | Attending: Pulmonary Disease | Admitting: Pulmonary Disease

## 2019-08-11 DIAGNOSIS — R0602 Shortness of breath: Secondary | ICD-10-CM | POA: Diagnosis not present

## 2019-08-11 DIAGNOSIS — Z8616 Personal history of COVID-19: Secondary | ICD-10-CM

## 2019-09-16 ENCOUNTER — Ambulatory Visit: Payer: BC Managed Care – PPO

## 2019-09-16 ENCOUNTER — Ambulatory Visit: Payer: BC Managed Care – PPO | Admitting: Pulmonary Disease

## 2019-10-25 ENCOUNTER — Other Ambulatory Visit: Payer: Self-pay | Admitting: Medical

## 2019-10-25 MED ORDER — METFORMIN HCL 500 MG PO TABS: 500.0000 mg | ORAL_TABLET | Freq: Two times a day (BID) | ORAL | 2 refills | Status: DC

## 2019-11-20 ENCOUNTER — Other Ambulatory Visit: Payer: Self-pay | Admitting: Medical

## 2020-01-12 ENCOUNTER — Other Ambulatory Visit: Payer: Self-pay | Admitting: Medical

## 2020-01-19 ENCOUNTER — Other Ambulatory Visit: Payer: Self-pay | Admitting: Medical

## 2020-03-17 ENCOUNTER — Other Ambulatory Visit: Payer: Self-pay | Admitting: Medical

## 2020-04-13 ENCOUNTER — Other Ambulatory Visit: Payer: Self-pay | Admitting: Medical

## 2020-05-09 ENCOUNTER — Other Ambulatory Visit: Payer: Self-pay | Admitting: Medical

## 2020-06-18 IMAGING — DX DG CHEST 2V
2 series · 2 of 2 positions shown · non-contrast
Comparison: Radiograph 06/26/2019

CLINICAL DATA: Post COVID short of breath.

EXAM:
CHEST - 2 VIEW

[chest pa]
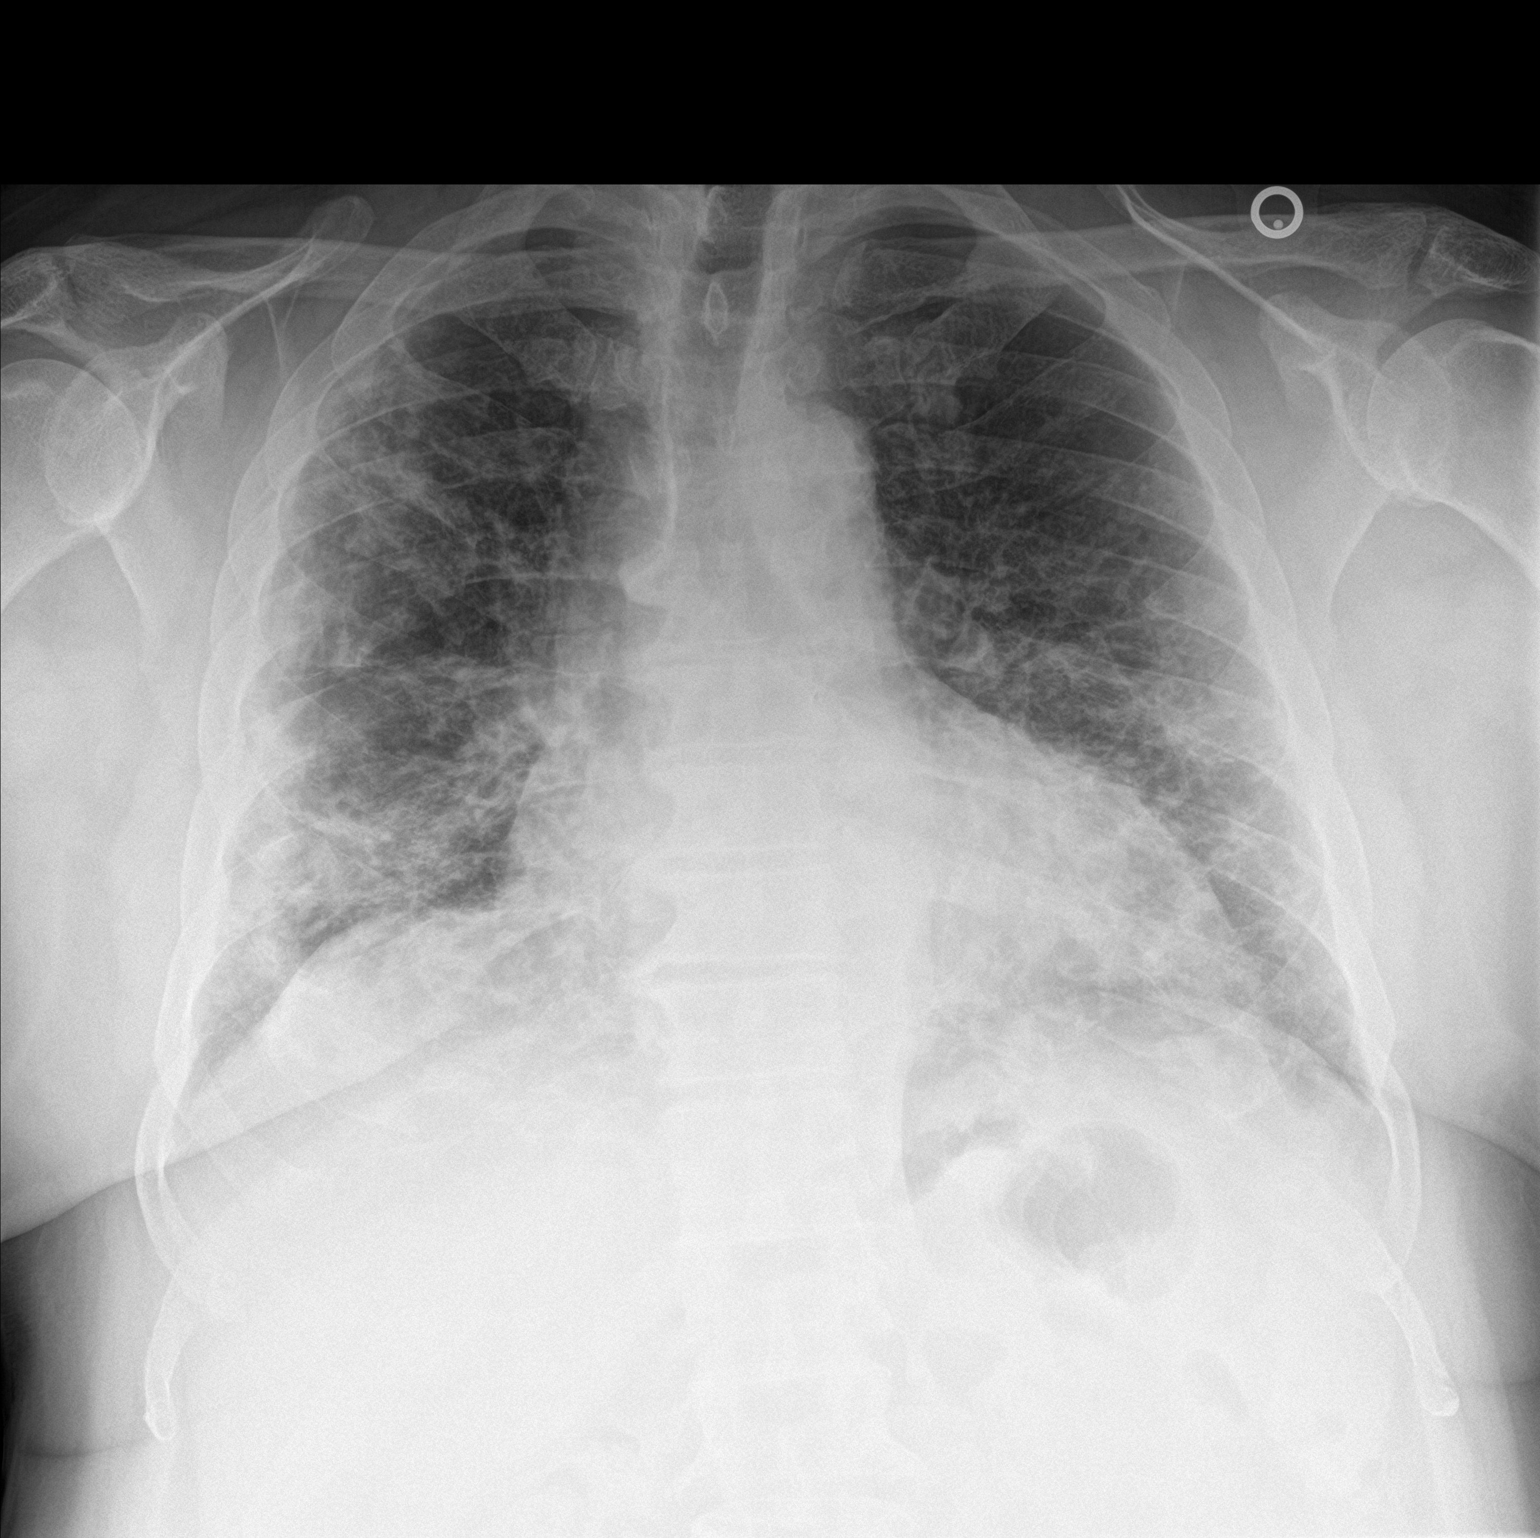

[chest lat]
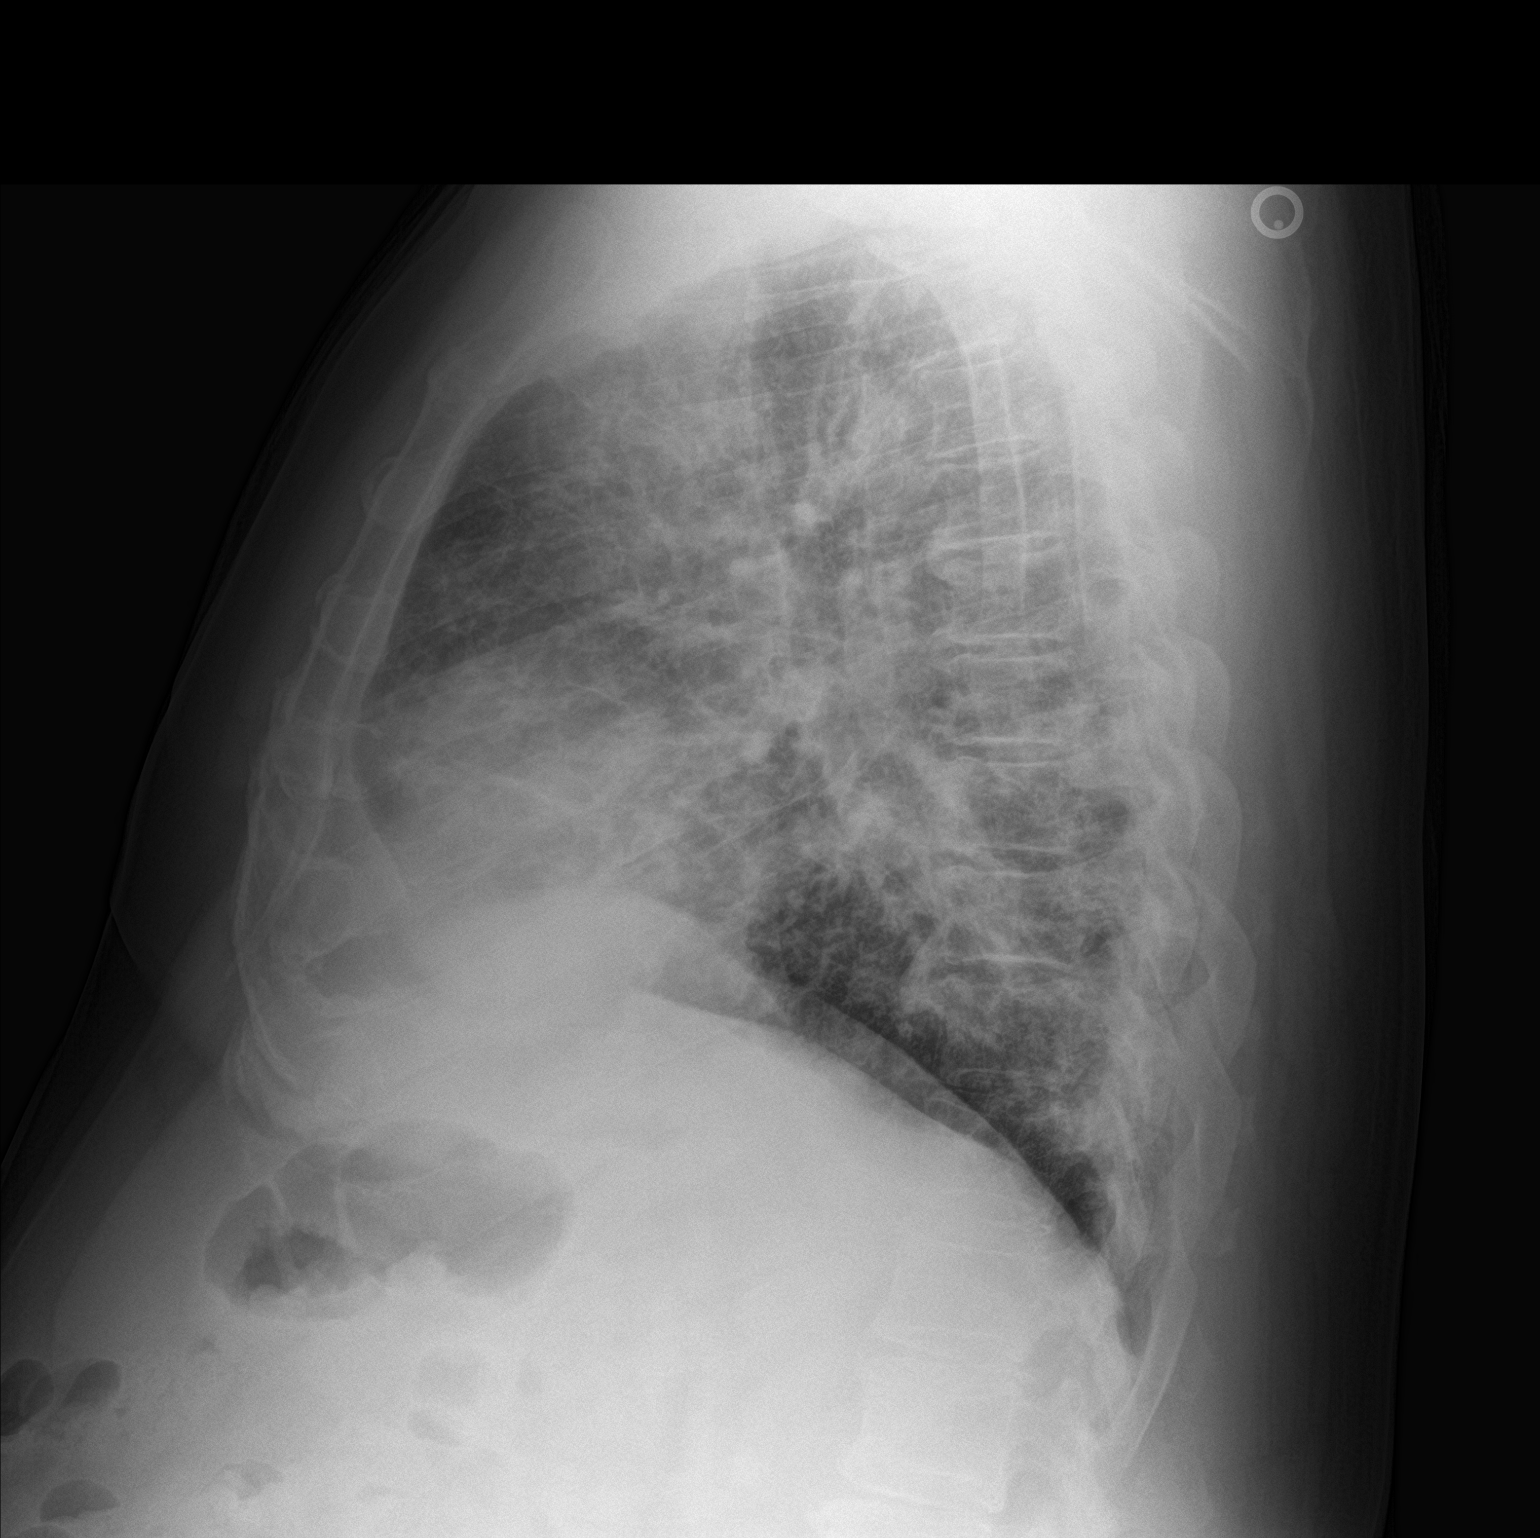

[2 of 2 positions shown; findings below may reference images not displayed]

FINDINGS: Stable mildly enlarged cardiac silhouette.

There is peripheral patchy airspace disease greater on the RIGHT
lung. No pleural fluid. No pneumothorax. No acute osseous
abnormality.
IMPRESSION: Peripheral airspace density consistent with viral pneumonia (COVID).

## 2020-07-02 ENCOUNTER — Other Ambulatory Visit: Payer: Self-pay | Admitting: Medical

## 2020-07-29 ENCOUNTER — Other Ambulatory Visit: Payer: Self-pay | Admitting: Medical

## 2020-07-30 ENCOUNTER — Telehealth: Payer: Self-pay | Admitting: Medical

## 2020-07-30 MED ORDER — LOSARTAN POTASSIUM 25 MG PO TABS
25.0000 mg | ORAL_TABLET | Freq: Every day | ORAL | 0 refills | Status: DC
Start: 2020-07-30 — End: 2020-09-27

## 2020-07-30 MED ORDER — METFORMIN HCL 500 MG PO TABS: 500.0000 mg | ORAL_TABLET | Freq: Two times a day (BID) | ORAL | 0 refills | Status: DC

## 2020-07-30 NOTE — Telephone Encounter (Signed)
Meds refilled.

## 2020-07-30 NOTE — Telephone Encounter (Signed)
Medication:metFORMIN (GLUCOPHAGE) 500 MG tablet [[id number]]     losartan (COZAAR) 25 MG tablet [[id number]]     Has the patient contacted their pharmacy? no (If no, request that the patient contact the pharmacy for the refill.) (If yes, when and what did the pharmacy advise?)    Preferred Pharmacy (with phone number or street name):  [id number] [place], [place] - [address]  [address] [place] [address]  Phone:  [phone] Fax:  [phone]   Agent: Please be advised that RX refills may take up to 3 business days. We ask that you follow-up with your pharmacy.

## 2020-08-24 ENCOUNTER — Other Ambulatory Visit: Payer: Self-pay | Admitting: Medical

## 2020-08-24 ENCOUNTER — Telehealth: Payer: Self-pay | Admitting: Medical

## 2020-08-24 NOTE — Telephone Encounter (Signed)
Pt wanted refill of bp meds. Not seen in year. Has he checked his bp recently? Would like a reading. Can you get him scheduled within a month/confirm scheduled. Then let me know will send in 30 tab rx. Early morning appointment since will want to get labs. Ask him to fast for 8 hours prior to lab.

## 2020-08-24 NOTE — Telephone Encounter (Signed)
Appt made

## 2020-08-24 NOTE — Telephone Encounter (Signed)
Declined. He need appointmet. Can you see message I just sent to [facility].

## 2020-08-24 NOTE — Telephone Encounter (Signed)
Pt has not been seen since 2020. Okay to refuse med?

## 2020-08-27 ENCOUNTER — Telehealth: Payer: Self-pay | Admitting: Medical

## 2020-08-27 ENCOUNTER — Other Ambulatory Visit: Payer: Self-pay

## 2020-08-27 ENCOUNTER — Ambulatory Visit (INDEPENDENT_AMBULATORY_CARE_PROVIDER_SITE_OTHER): Payer: Medicare Other | Admitting: Medical

## 2020-08-27 VITALS — BP 129/77 | HR 77 | Resp 18 | Ht 68.0 in | Wt 237.0 lb

## 2020-08-27 DIAGNOSIS — Z1211 Encounter for screening for malignant neoplasm of colon: Secondary | ICD-10-CM | POA: Diagnosis not present

## 2020-08-27 DIAGNOSIS — R350 Frequency of micturition: Secondary | ICD-10-CM

## 2020-08-27 DIAGNOSIS — Z125 Encounter for screening for malignant neoplasm of prostate: Secondary | ICD-10-CM | POA: Diagnosis not present

## 2020-08-27 DIAGNOSIS — I10 Essential (primary) hypertension: Secondary | ICD-10-CM

## 2020-08-27 DIAGNOSIS — E785 Hyperlipidemia, unspecified: Secondary | ICD-10-CM | POA: Diagnosis not present

## 2020-08-27 DIAGNOSIS — E119 Type 2 diabetes mellitus without complications: Secondary | ICD-10-CM | POA: Diagnosis not present

## 2020-08-27 LAB — COMPREHENSIVE METABOLIC PANEL
ALT: 23 U/L (ref 0–53)
AST: 21 U/L (ref 0–37)
Albumin: 4.6 g/dL (ref 3.5–5.2)
Alkaline Phosphatase: 75 U/L (ref 39–117)
BUN: 12 mg/dL (ref 6–23)
CO2: 29 mEq/L (ref 19–32)
Calcium: 9.7 mg/dL (ref 8.4–10.5)
Chloride: 103 mEq/L (ref 96–112)
Creatinine, Ser: 1.01 mg/dL (ref 0.40–1.50)
GFR: 78.21 mL/min (ref >60.00)
Glucose, Bld: 82 mg/dL (ref 70–99)
Potassium: 4.5 mEq/L (ref 3.5–5.1)
Sodium: 139 mEq/L (ref 135–145)
Total Bilirubin: 1.1 mg/dL (ref 0.2–1.2)
Total Protein: 7 g/dL (ref 6.0–8.3)

## 2020-08-27 LAB — CBC WITH DIFFERENTIAL/PLATELET
Basophils Absolute: 0.1 10*3/uL (ref 0.0–0.1)
Basophils Relative: 0.7 % (ref 0.0–3.0)
Eosinophils Absolute: 0.1 10*3/uL (ref 0.0–0.7)
Eosinophils Relative: 0.7 % (ref 0.0–5.0)
HCT: 47.4 % (ref 39.0–52.0)
Hemoglobin: 15.9 g/dL (ref 13.0–17.0)
Lymphocytes Relative: 18.2 % (ref 12.0–46.0)
Lymphs Abs: 2 10*3/uL (ref 0.7–4.0)
MCHC: 33.6 g/dL (ref 30.0–36.0)
MCV: 92.1 fl (ref 78.0–100.0)
Monocytes Absolute: 0.8 10*3/uL (ref 0.1–1.0)
Monocytes Relative: 7.3 % (ref 3.0–12.0)
Neutro Abs: 8 10*3/uL — ABNORMAL HIGH (ref 1.4–7.7)
Neutrophils Relative %: 73.1 % (ref 43.0–77.0)
Platelets: 190 10*3/uL (ref 150.0–400.0)
RBC: 5.14 Mil/uL (ref 4.22–5.81)
RDW: 14.2 % (ref 11.5–15.5)
WBC: 10.9 10*3/uL — ABNORMAL HIGH (ref 4.0–10.5)

## 2020-08-27 LAB — LIPID PANEL
Cholesterol: 153 mg/dL (ref 0–200)
HDL: 38.7 mg/dL — ABNORMAL LOW (ref >39.00)
LDL Cholesterol: 97 mg/dL (ref 0–99)
NonHDL: 114
Total CHOL/HDL Ratio: 4
Triglycerides: 84 mg/dL (ref 0.0–149.0)
VLDL: 16.8 mg/dL (ref 0.0–40.0)

## 2020-08-27 LAB — PSA: PSA: 3.84 ng/mL (ref 0.10–4.00)

## 2020-08-27 LAB — HEMOGLOBIN A1C: Hgb A1c MFr Bld: 5.7 % (ref 4.6–6.5)

## 2020-08-27 NOTE — Patient Instructions (Signed)
History of diabetes and last A1c was 7.1.  Recommend continue Metformin but will also follow your A1c to see impact of your significant purposeful weight loss.  Good job on the weight loss.  History of hypertension and blood pressure well controlled today.  Continue losartan.  Your weight loss should have impact on blood pressure level as well.  History of hyperlipidemia and on Lipitor.  Recommend continuing at present.  Will check fasting CMP and lipid panel today.  Hopefully lipids are improved.  Some frequent urination recently.  Decided to go ahead and get PSA for screening cancer.  Also sometimes BPH can be a cause for frequent urination.  However in your case amount of fluid intake probably the main factor.  For screening colon cancer we will place order for Cologuard.  Follow-up in 3 months or as needed.

## 2020-08-27 NOTE — Telephone Encounter (Signed)
Future psa placed. 

## 2020-08-27 NOTE — Progress Notes (Signed)
Subjective:    Patient ID: Nicholas Marks, male    DOB: 05-Feb-1956, 65 y.o.   MRN: [id number]  HPI  Pt in for follow up.  Pt has hx of high cholesterol. He does not like the idea of being on atorvastatin.  Pt last lipid one year ago elevated.   The 10-year ASCVD risk score Mikey Bussing DC Brooke Bonito., et al., 2013) is: 26.5%   Values used to calculate the score:     Age: 16 years     Sex: Male     Is Non-Hispanic African American: No     Diabetic: Yes     Tobacco smoker: No     Systolic Blood Pressure: 245 mmHg     Is BP treated: No     HDL Cholesterol: 37.1 mg/dL     Total Cholesterol: 190 mg/dL   Pt has hx of htn. BP controlled today.  Pt has purposeful wt loss 277 down to 237 today. Wt loss is down since November.  Pt not up to date on colonoscopy. No family colon cancer or polyp.   Hx of diabetes. He is on metformin.   Review of Systems  Constitutional: Negative for chills, fatigue and fever.  HENT: Negative for congestion and ear discharge.   Respiratory: Negative for cough, chest tightness, shortness of breath and wheezing.   Cardiovascular: Negative for chest pain and palpitations.  Gastrointestinal: Negative for abdominal pain.  Genitourinary: Positive for frequency. Negative for dysuria.       Some but does drink fair amount of water.  Musculoskeletal: Negative for back pain, joint swelling and neck pain.  Skin: Negative for rash.  Neurological: Negative for dizziness, weakness, numbness and headaches.  Hematological: Negative for adenopathy. Does not bruise/bleed easily.  Psychiatric/Behavioral: Negative for behavioral problems, confusion and suicidal ideas. The patient is not nervous/anxious.      Past Medical History:  Diagnosis Date  . Kidney stone      Social History   Socioeconomic History  . Marital status: Widowed    Spouse name: Not on file  . Number of children: Not on file  . Years of education: Not on file  . Highest education level: Not on file   Occupational History  . Not on file  Tobacco Use  . Smoking status: Former Smoker    Packs/day: 1.50    Years: 25.00    Pack years: 37.50    Types: Cigarettes    Quit date: 06/21/2004    Years since quitting: 16.1  . Smokeless tobacco: Never Used  Vaping Use  . Vaping Use: Former  Substance and Sexual Activity  . Alcohol use: Yes    Alcohol/week: 2.0 standard drinks    Types: 2 Shots of liquor per week    Comment: socially might go months  . Drug use: Never  . Sexual activity: Not on file  Other Topics Concern  . Not on file  Social History Narrative  . Not on file   Social Determinants of Health   Financial Resource Strain: Not on file  Food Insecurity: Not on file  Transportation Needs: Not on file  Physical Activity: Not on file  Stress: Not on file  Social Connections: Not on file  Intimate Partner Violence: Not on file    Past Surgical History:  Procedure Laterality Date  . APPENDECTOMY      No family history on file.  No Known Allergies  Current Outpatient Medications on File Prior to Visit  Medication Sig Dispense  Refill  . atorvastatin (LIPITOR) 10 MG tablet TAKE 1 TABLET BY MOUTH EVERY DAY 30 tablet 0  . blood glucose meter kit and supplies KIT Dispense based on patient and insurance preference. Use up to four times daily as directed. (FOR ICD-9 250.00, 250.01). 1 each 0  . Blood Glucose Monitoring Suppl (ONETOUCH VERIO) w/Device KIT Use as directed to check sugar 3 times a day.  Dx Code: E11.9 1 kit 0  . glucose blood (ONETOUCH VERIO) test strip Use as instructed 300 each 1  . loratadine-pseudoephedrine (CLARITIN-D 24-HOUR) 10-240 MG 24 hr tablet Take 1 tablet by mouth daily as needed for allergies.    Marland Kitchen losartan (COZAAR) 25 MG tablet Take 1 tablet (25 mg total) by mouth daily. 30 tablet 0  . metFORMIN (GLUCOPHAGE) 500 MG tablet Take 1 tablet (500 mg total) by mouth 2 (two) times daily with a meal. 60 tablet 0  . OneTouch Delica Lancets 73U MISC Use as  directed 3 times a day.  Dx Code E11.9 300 each 1   No current facility-administered medications on file prior to visit.    BP 129/77   Pulse 77   Resp 18   Ht 5' 8"  (1.727 m)   Wt 237 lb (107.5 kg)   SpO2 96%   BMI 36.04 kg/m       Objective:   Physical Exam  General Mental Status- Alert. General Appearance- Not in acute distress.   Skin General: Color- Normal Color. Moisture- Normal Moisture.  Neck Carotid Arteries- Normal color. Moisture- Normal Moisture. No carotid bruits. No JVD.  Chest and Lung Exam Auscultation: Breath Sounds:-Normal.  Cardiovascular Auscultation:Rythm- Regular. Murmurs & Other Heart Sounds:Auscultation of the heart reveals- No Murmurs.  Abdomen Inspection:-Inspeection Normal. Palpation/Percussion:Note:No mass. Palpation and Percussion of the abdomen reveal- Non Tender, Non Distended + BS, no rebound or guarding.   Neurologic Cranial Nerve exam:- CN III-XII intact(No nystagmus), symmetric smile. Strength:- 5/5 equal and symmetric strength both upper and lower extremities.     Assessment & Plan:  History of diabetes and last A1c was 7.1.  Recommend continue Metformin but will also follow your A1c to see impact of your significant purposeful weight loss.  Good job on the weight loss.  History of hypertension and blood pressure well controlled today.  Continue losartan.  Your weight loss should have impact on blood pressure level as well.  History of hyperlipidemia and on Lipitor.  Recommend continuing at present.  Will check fasting CMP and lipid panel today.  Hopefully lipids are improved.  Some frequent urination recently.  Decided to go ahead and get PSA for screening cancer.  Also sometimes BPH can be a cause for frequent urination.  However in your case amount of fluid intake probably the main factor.  For screening colon cancer we will place order for Cologuard.  Follow-up in 3 months or as needed.

## 2020-08-28 ENCOUNTER — Other Ambulatory Visit: Payer: Self-pay

## 2020-08-28 ENCOUNTER — Other Ambulatory Visit: Payer: Self-pay | Admitting: Medical

## 2020-08-28 DIAGNOSIS — Z1211 Encounter for screening for malignant neoplasm of colon: Secondary | ICD-10-CM

## 2020-08-28 MED ORDER — ATORVASTATIN CALCIUM 10 MG PO TABS: 10.0000 mg | ORAL_TABLET | Freq: Every day | ORAL | 0 refills | Status: DC

## 2020-09-19 ENCOUNTER — Other Ambulatory Visit: Payer: Self-pay | Admitting: Medical

## 2020-09-19 LAB — COLOGUARD: COLOGUARD: NEGATIVE

## 2020-09-27 ENCOUNTER — Telehealth: Payer: Self-pay | Admitting: Medical

## 2020-09-27 MED ORDER — METFORMIN HCL 500 MG PO TABS: 500.0000 mg | ORAL_TABLET | Freq: Two times a day (BID) | ORAL | 0 refills | Status: DC

## 2020-09-27 MED ORDER — LOSARTAN POTASSIUM 25 MG PO TABS: 25.0000 mg | ORAL_TABLET | Freq: Every day | ORAL | 0 refills | Status: DC

## 2020-09-27 NOTE — Telephone Encounter (Signed)
Medication: metFORMIN (GLUCOPHAGE) 500 MG tablet  losartan (COZAAR) 25 MG tablet      Has the patient contacted their pharmacy?  (If no, request that the patient contact the pharmacy for the refill.) (If yes, when and what did the pharmacy advise?)     Preferred Pharmacy (with phone number or street name): [id number] [place] [facility], [place] - [address]  [address] [place] [address]  Phone:  [phone] Fax:  [phone]      Agent: Please be advised that RX refills may take up to 3 business days. We ask that you follow-up with your pharmacy.

## 2020-09-27 NOTE — Telephone Encounter (Signed)
Rx sent 

## 2020-10-05 ENCOUNTER — Other Ambulatory Visit (INDEPENDENT_AMBULATORY_CARE_PROVIDER_SITE_OTHER): Payer: Medicare Other

## 2020-10-05 ENCOUNTER — Other Ambulatory Visit: Payer: Self-pay

## 2020-10-05 DIAGNOSIS — R350 Frequency of micturition: Secondary | ICD-10-CM

## 2020-10-05 LAB — PSA: PSA: 3.5 ng/mL (ref 0.10–4.00)

## 2020-10-19 ENCOUNTER — Other Ambulatory Visit: Payer: Self-pay | Admitting: Medical

## 2020-11-10 ENCOUNTER — Other Ambulatory Visit: Payer: Self-pay | Admitting: Medical

## 2020-11-26 ENCOUNTER — Encounter: Payer: Self-pay | Admitting: Medical

## 2020-11-26 ENCOUNTER — Ambulatory Visit (INDEPENDENT_AMBULATORY_CARE_PROVIDER_SITE_OTHER): Payer: Medicare Other | Admitting: Medical

## 2020-11-26 ENCOUNTER — Other Ambulatory Visit: Payer: Self-pay

## 2020-11-26 VITALS — BP 132/77 | HR 67 | Temp 98.2°F | Resp 17 | Ht 68.0 in | Wt 221.6 lb

## 2020-11-26 DIAGNOSIS — E119 Type 2 diabetes mellitus without complications: Secondary | ICD-10-CM

## 2020-11-26 DIAGNOSIS — E785 Hyperlipidemia, unspecified: Secondary | ICD-10-CM

## 2020-11-26 DIAGNOSIS — I10 Essential (primary) hypertension: Secondary | ICD-10-CM | POA: Diagnosis not present

## 2020-11-26 LAB — COMPREHENSIVE METABOLIC PANEL
ALT: 21 U/L (ref 0–53)
AST: 20 U/L (ref 0–37)
Albumin: 4.6 g/dL (ref 3.5–5.2)
Alkaline Phosphatase: 64 U/L (ref 39–117)
BUN: 16 mg/dL (ref 6–23)
CO2: 30 mEq/L (ref 19–32)
Calcium: 9.5 mg/dL (ref 8.4–10.5)
Chloride: 104 mEq/L (ref 96–112)
Creatinine, Ser: 0.89 mg/dL (ref 0.40–1.50)
GFR: 89.97 mL/min (ref >60.00)
Glucose, Bld: 94 mg/dL (ref 70–99)
Potassium: 5.1 mEq/L (ref 3.5–5.1)
Sodium: 142 mEq/L (ref 135–145)
Total Bilirubin: 1 mg/dL (ref 0.2–1.2)
Total Protein: 7 g/dL (ref 6.0–8.3)

## 2020-11-26 LAB — LIPID PANEL
Cholesterol: 117 mg/dL (ref 0–200)
HDL: 44.4 mg/dL (ref >39.00)
LDL Cholesterol: 62 mg/dL (ref 0–99)
NonHDL: 72.84
Total CHOL/HDL Ratio: 3
Triglycerides: 56 mg/dL (ref 0.0–149.0)
VLDL: 11.2 mg/dL (ref 0.0–40.0)

## 2020-11-26 LAB — HEMOGLOBIN A1C: Hgb A1c MFr Bld: 5.6 % (ref 4.6–6.5)

## 2020-11-26 NOTE — Patient Instructions (Addendum)
Your bp is borderline in office but at home 132/77 with some white coat component. Continue losartan 25 mg daily.  For high cholesterol continue low cholesterol diet. Rx atorvastatin in past. Currently recommend continue. 10 year risk factor score high.  For diabetes continue low sugar diet. Noom probably is diabetic friendly. Continue metformin and check a1c today.  Good job loosing weight. Continue healthy diet.  For allergic rhinitis you could try xyzal antihistamine.  Follow up date to be determined after lab review.

## 2020-11-26 NOTE — Progress Notes (Signed)
Subjective:    Patient ID: Nicholas Marks, male    DOB: [birth year], 65 y.o.   MRN: [id number]  HPI  Pt in for follow up.  Pt has htn. When he checks bp 544-920 systolic. On losartan 25 mg daily.  Pt has some allergic rhinitis. He takes claritin over the counter. Even with D version describes bp controlled.  Pt is diabetic. Last a1c 5.7. Pt is on metformin.   Pt has been doing Noom wt loss program. Continue to loose.  Pt called and got his cologuard test result and it was negative. Pt called company directly and saw was negative.  Has high cholesterol. The 10-year ASCVD risk score Mikey Bussing DC Brooke Bonito., et al., 2013) is: 29.1%   Values used to calculate the score:     Age: 29 years     Sex: Male     Is Non-Hispanic African American: No     Diabetic: Yes     Tobacco smoker: No     Systolic Blood Pressure: 100 mmHg     Is BP treated: Yes     HDL Cholesterol: 38.7 mg/dL     Total Cholesterol: 153 mg/dL     Review of Systems  Constitutional: Negative for chills, fatigue and fever.  HENT: Negative for congestion, ear discharge and facial swelling.   Respiratory: Negative for chest tightness, shortness of breath and wheezing.   Cardiovascular: Negative for chest pain and palpitations.  Gastrointestinal: Negative for abdominal pain, diarrhea, nausea and vomiting.  Genitourinary: Negative for dysuria.  Musculoskeletal: Negative for back pain.  Skin: Negative for rash.  Neurological: Negative for dizziness, seizures, weakness and headaches.  Hematological: Negative for adenopathy. Does not bruise/bleed easily.  Psychiatric/Behavioral: Negative for behavioral problems, self-injury and suicidal ideas. The patient is not nervous/anxious.     Past Medical History:  Diagnosis Date  . Kidney stone      Social History   Socioeconomic History  . Marital status: Widowed    Spouse name: Not on file  . Number of children: Not on file  . Years of education: Not on file  . Highest  education level: Not on file  Occupational History  . Not on file  Tobacco Use  . Smoking status: Former Smoker    Packs/day: 1.50    Years: 25.00    Pack years: 37.50    Types: Cigarettes    Quit date: 06/21/2004    Years since quitting: 16.4  . Smokeless tobacco: Never Used  Vaping Use  . Vaping Use: Former  Substance and Sexual Activity  . Alcohol use: Yes    Alcohol/week: 2.0 standard drinks    Types: 2 Shots of liquor per week    Comment: socially might go months  . Drug use: Never  . Sexual activity: Not on file  Other Topics Concern  . Not on file  Social History Narrative  . Not on file   Social Determinants of Health   Financial Resource Strain: Not on file  Food Insecurity: Not on file  Transportation Needs: Not on file  Physical Activity: Not on file  Stress: Not on file  Social Connections: Not on file  Intimate Partner Violence: Not on file    Past Surgical History:  Procedure Laterality Date  . APPENDECTOMY      No family history on file.  No Known Allergies  Current Outpatient Medications on File Prior to Visit  Medication Sig Dispense Refill  . atorvastatin (LIPITOR) 10 MG tablet Take 1  tablet (10 mg total) by mouth daily. 90 tablet 0  . blood glucose meter kit and supplies KIT Dispense based on patient and insurance preference. Use up to four times daily as directed. (FOR ICD-9 250.00, 250.01). 1 each 0  . Blood Glucose Monitoring Suppl (ONETOUCH VERIO) w/Device KIT Use as directed to check sugar 3 times a day.  Dx Code: E11.9 1 kit 0  . glucose blood (ONETOUCH VERIO) test strip Use as instructed 300 each 1  . loratadine-pseudoephedrine (CLARITIN-D 24-HOUR) 10-240 MG 24 hr tablet Take 1 tablet by mouth daily as needed for allergies.    Marland Kitchen losartan (COZAAR) 25 MG tablet TAKE 1 TABLET (25 MG TOTAL) BY MOUTH DAILY. 30 tablet 1  . metFORMIN (GLUCOPHAGE) 500 MG tablet TAKE 1 TABLET BY MOUTH 2 TIMES DAILY WITH A MEAL. 60 tablet 1  . OneTouch Delica  Lancets 63O MISC Use as directed 3 times a day.  Dx Code E11.9 300 each 1   No current facility-administered medications on file prior to visit.    BP 139/74   Pulse 67   Temp 98.2 F (36.8 C)   Resp 17   Ht 5' 8"  (1.727 m)   Wt 221 lb 9.6 oz (100.5 kg)   SpO2 99%   BMI 33.69 kg/m       Objective:   Physical Exam  General Mental Status- Alert. General Appearance- Not in acute distress.   Skin General: Color- Normal Color. Moisture- Normal Moisture.  Neck Carotid Arteries- Normal color. Moisture- Normal Moisture. No carotid bruits. No JVD.  Chest and Lung Exam Auscultation: Breath Sounds:-Normal.  Cardiovascular Auscultation:Rythm- Regular. Murmurs & Other Heart Sounds:Auscultation of the heart reveals- No Murmurs.  Abdomen Inspection:-Inspeection Normal. Palpation/Percussion:Note:No mass. Palpation and Percussion of the abdomen reveal- Non Tender, Non Distended + BS, no rebound or guarding.   Neurologic Cranial Nerve exam:- CN III-XII intact(No nystagmus), symmetric smile. Strength:- 5/5 equal and symmetric strength both upper and lower extremities.      Assessment & Plan:  Your bp is borderline in office but at home 132/77 with some white coat component. Continue losartan 25 mg daily.  For high cholesterol continue low cholesterol diet. Rx atorvastatin in past. Currently recommend continue. 10 year risk factor score high.  For diabetes continue low sugar diet. Noom probably is diabetic friendly. Continue metformin and check a1c today.  Good job loosing weight. Continue healthy diet.  For allergic rhinitis you could try xyzal antihistamine.  Follow up date to be determined after lab review.  Mackie Pai, PA-C

## 2020-11-28 ENCOUNTER — Encounter: Payer: Self-pay | Admitting: Medical

## 2020-11-29 ENCOUNTER — Other Ambulatory Visit: Payer: Self-pay

## 2020-11-29 DIAGNOSIS — Z1211 Encounter for screening for malignant neoplasm of colon: Secondary | ICD-10-CM

## 2020-11-29 LAB — COLOGUARD

## 2020-12-06 ENCOUNTER — Other Ambulatory Visit: Payer: Self-pay | Admitting: Medical

## 2020-12-16 ENCOUNTER — Other Ambulatory Visit: Payer: Self-pay | Admitting: Medical

## 2021-03-13 ENCOUNTER — Other Ambulatory Visit: Payer: Self-pay | Admitting: Medical

## 2021-05-29 ENCOUNTER — Ambulatory Visit (INDEPENDENT_AMBULATORY_CARE_PROVIDER_SITE_OTHER): Payer: Medicare Other | Admitting: Medical

## 2021-05-29 ENCOUNTER — Encounter: Payer: Self-pay | Admitting: Medical

## 2021-05-29 ENCOUNTER — Other Ambulatory Visit: Payer: Self-pay | Admitting: Medical

## 2021-05-29 ENCOUNTER — Other Ambulatory Visit: Payer: Self-pay

## 2021-05-29 VITALS — BP 139/76 | HR 71 | Resp 18 | Ht 68.0 in | Wt 229.0 lb

## 2021-05-29 DIAGNOSIS — E785 Hyperlipidemia, unspecified: Secondary | ICD-10-CM | POA: Diagnosis not present

## 2021-05-29 DIAGNOSIS — E119 Type 2 diabetes mellitus without complications: Secondary | ICD-10-CM | POA: Diagnosis not present

## 2021-05-29 DIAGNOSIS — I10 Essential (primary) hypertension: Secondary | ICD-10-CM | POA: Diagnosis not present

## 2021-05-29 LAB — COMPREHENSIVE METABOLIC PANEL
ALT: 20 U/L (ref 0–53)
AST: 19 U/L (ref 0–37)
Albumin: 4.6 g/dL (ref 3.5–5.2)
Alkaline Phosphatase: 55 U/L (ref 39–117)
BUN: 16 mg/dL (ref 6–23)
CO2: 30 mEq/L (ref 19–32)
Calcium: 9.4 mg/dL (ref 8.4–10.5)
Chloride: 104 mEq/L (ref 96–112)
Creatinine, Ser: 0.87 mg/dL (ref 0.40–1.50)
GFR: 90.27 mL/min (ref >60.00)
Glucose, Bld: 89 mg/dL (ref 70–99)
Potassium: 4.9 mEq/L (ref 3.5–5.1)
Sodium: 141 mEq/L (ref 135–145)
Total Bilirubin: 1 mg/dL (ref 0.2–1.2)
Total Protein: 7 g/dL (ref 6.0–8.3)

## 2021-05-29 LAB — LIPID PANEL
Cholesterol: 132 mg/dL (ref 0–200)
HDL: 43 mg/dL (ref >39.00)
LDL Cholesterol: 77 mg/dL (ref 0–99)
NonHDL: 89.12
Total CHOL/HDL Ratio: 3
Triglycerides: 62 mg/dL (ref 0.0–149.0)
VLDL: 12.4 mg/dL (ref 0.0–40.0)

## 2021-05-29 LAB — HEMOGLOBIN A1C: Hgb A1c MFr Bld: 5.7 % (ref 4.6–6.5)

## 2021-05-29 MED ORDER — LEVOCETIRIZINE DIHYDROCHLORIDE 5 MG PO TABS: 5.0000 mg | ORAL_TABLET | Freq: Every evening | ORAL | 3 refills | Status: DC

## 2021-05-29 MED ORDER — FLUTICASONE PROPIONATE 50 MCG/ACT NA SUSP: 2.0000 | Freq: Every day | NASAL | 1 refills | Status: DC

## 2021-05-29 NOTE — Patient Instructions (Addendum)
Htn well controlled. Continue losartan 25 mg daily.  Diabetes. Check a1c. Continue metformin and may adjust treatment after lab. Check cmp and a1c.  High cholesterol. Continue lipitor and check lipid panel today.  For allergies use flonase and xyzal. Some mild sinus pressure to palpation. If this worsens let me know and will rx antibiotic.  Follow up date to be determined after lab review. Usually every 3 months with diabetes but if very well controlled could extend to 6 months.

## 2021-05-29 NOTE — Progress Notes (Signed)
Subjective:    Patient ID: Nicholas Marks, male    DOB: January 14, 1956, 65 y.o.   MRN: [id number]  HPI  Pt has htn. On losartan 25 mg daily.  For high cholesterol on  atorvastatin in past. Currently recommend continue   Diabetes continue low sugar diet. Noom diet which is probably diabetic friendly. Continue metformin and check a1c today.   Allergies this time of the year. Pt feels congestion with mid sinus pressure.    Review of Systems  Constitutional:  Negative for chills, fatigue and fever.  HENT:  Positive for congestion. Negative for facial swelling, hearing loss, nosebleeds, postnasal drip, sinus pressure and sore throat.   Respiratory:  Negative for cough and chest tightness.   Cardiovascular:  Negative for chest pain and palpitations.  Gastrointestinal:  Negative for abdominal pain, blood in stool, nausea and rectal pain.  Genitourinary:  Negative for dysuria and flank pain.  Musculoskeletal:  Negative for back pain, joint swelling and neck pain.  Skin:  Negative for rash.    Past Medical History:  Diagnosis Date   Kidney stone      Social History   Socioeconomic History   Marital status: Widowed    Spouse name: Not on file   Number of children: Not on file   Years of education: Not on file   Highest education level: Not on file  Occupational History   Not on file  Tobacco Use   Smoking status: Former    Packs/day: 1.50    Years: 25.00    Pack years: 37.50    Types: Cigarettes    Quit date: 06/21/2004    Years since quitting: 16.9   Smokeless tobacco: Never  Vaping Use   Vaping Use: Former  Substance and Sexual Activity   Alcohol use: Yes    Alcohol/week: 2.0 standard drinks    Types: 2 Shots of liquor per week    Comment: socially might go months   Drug use: Never   Sexual activity: Not on file  Other Topics Concern   Not on file  Social History Narrative   Not on file   Social Determinants of Health   Financial Resource Strain: Not on file   Food Insecurity: Not on file  Transportation Needs: Not on file  Physical Activity: Not on file  Stress: Not on file  Social Connections: Not on file  Intimate Partner Violence: Not on file    Past Surgical History:  Procedure Laterality Date   APPENDECTOMY      No family history on file.  No Known Allergies  Current Outpatient Medications on File Prior to Visit  Medication Sig Dispense Refill   atorvastatin (LIPITOR) 10 MG tablet TAKE 1 TABLET BY MOUTH EVERY DAY 90 tablet 1   blood glucose meter kit and supplies KIT Dispense based on patient and insurance preference. Use up to four times daily as directed. (FOR ICD-9 250.00, 250.01). 1 each 0   Blood Glucose Monitoring Suppl (ONETOUCH VERIO) w/Device KIT Use as directed to check sugar 3 times a day.  Dx Code: E11.9 1 kit 0   glucose blood (ONETOUCH VERIO) test strip Use as instructed 300 each 1   loratadine-pseudoephedrine (CLARITIN-D 24-HOUR) 10-240 MG 24 hr tablet Take 1 tablet by mouth daily as needed for allergies.     losartan (COZAAR) 25 MG tablet Take 1 tablet (25 mg total) by mouth daily. 90 tablet 1   metFORMIN (GLUCOPHAGE) 500 MG tablet Take 1 tablet (500 mg total)  by mouth 2 (two) times daily with a meal. 180 tablet 1   OneTouch Delica Lancets 34K MISC Use as directed 3 times a day.  Dx Code E11.9 300 each 1   No current facility-administered medications on file prior to visit.    BP 139/76 Comment: 128/68 at home.  Pulse 71   Resp 18   Ht 5' 8"  (1.727 m)   Wt 229 lb (103.9 kg)   SpO2 100%   BMI 34.82 kg/m       Objective:   Physical Exam  General Mental Status- Alert. General Appearance- Not in acute distress.   Skin General: Color- Normal Color. Moisture- Normal Moisture.  Neck Carotid Arteries- Normal color. Moisture- Normal Moisture. No carotid bruits. No JVD.  Chest and Lung Exam Auscultation: Breath Sounds:-Normal.  Cardiovascular Auscultation:Rythm- Regular. Murmurs & Other Heart  Sounds:Auscultation of the heart reveals- No Murmurs.  Abdomen Inspection:-Inspeection Normal. Palpation/Percussion:Note:No mass. Palpation and Percussion of the abdomen reveal- Non Tender, Non Distended + BS, no rebound or guarding.   Neurologic Cranial Nerve exam:- CN III-XII intact(No nystagmus), symmetric smile. Strength:- 5/5 equal and symmetric strength both upper and lower extremities.   Heent- boggy turbinates.+pnd. mild left maxillary sinus pressure on palpation.    Assessment & Plan:   Patient Instructions  Htn well controlled. Continue losartan 25 mg daily.  Diabetes. Check a1c. Continue metformin and may adjust treatment after lab. Check cmp and a1c.  High cholesterol. Continue lipitor and check lipid panel today.  For allergies use flonase and xyzal. Some mild sinus pressure to palpation. If this worsens let me know and will rx antibiotic.  Follow up date to be determined after lab review. Usually every 3 months with diabetes but if very well controlled could extend to 6 months.       Mackie Pai, PA-C

## 2021-06-17 ENCOUNTER — Other Ambulatory Visit: Payer: Self-pay | Admitting: Medical

## 2021-06-23 ENCOUNTER — Other Ambulatory Visit: Payer: Self-pay | Admitting: Medical

## 2021-08-22 ENCOUNTER — Telehealth: Payer: Self-pay | Admitting: Medical

## 2021-08-22 NOTE — Telephone Encounter (Signed)
Left message for patient to call back and schedule Medicare Annual Wellness Visit (AWV) in office.  ° °If not able to come in office, please offer to do virtually or by telephone.  Left office number and my jabber #[phone]. ° °Due for AWVI ° °Please schedule at anytime with Nurse Health Advisor. °  °

## 2021-08-26 ENCOUNTER — Other Ambulatory Visit: Payer: Self-pay | Admitting: Medical

## 2021-09-18 ENCOUNTER — Other Ambulatory Visit: Payer: Self-pay | Admitting: Medical

## 2021-09-26 ENCOUNTER — Telehealth: Payer: Self-pay | Admitting: Medical

## 2021-09-26 NOTE — Telephone Encounter (Signed)
Left message for patient to call back and schedule Medicare Annual Wellness Visit (AWV) in office.  ° °If not able to come in office, please offer to do virtually or by telephone.  Left office number and my jabber #[phone]. ° °Due for AWVI ° °Please schedule at anytime with Nurse Health Advisor. °  °

## 2021-09-27 ENCOUNTER — Telehealth: Payer: Self-pay | Admitting: Medical

## 2021-09-27 NOTE — Telephone Encounter (Signed)
Left message for patient to call back and schedule Medicare Annual Wellness Visit (AWV) in office.  ° °If not able to come in office, please offer to do virtually or by telephone.  Left office number and my jabber #[phone]. ° °Due for AWVI ° °Please schedule at anytime with Nurse Health Advisor. °  °

## 2021-10-20 ENCOUNTER — Other Ambulatory Visit: Payer: Self-pay | Admitting: Medical

## 2021-10-23 ENCOUNTER — Other Ambulatory Visit: Payer: Self-pay | Admitting: Medical

## 2021-11-29 ENCOUNTER — Encounter: Payer: Self-pay | Admitting: Medical

## 2021-11-29 ENCOUNTER — Ambulatory Visit (INDEPENDENT_AMBULATORY_CARE_PROVIDER_SITE_OTHER): Payer: Medicare Other | Admitting: Medical

## 2021-11-29 VITALS — BP 132/68 | HR 78 | Temp 98.0°F | Ht 68.0 in | Wt 236.2 lb

## 2021-11-29 DIAGNOSIS — E785 Hyperlipidemia, unspecified: Secondary | ICD-10-CM | POA: Diagnosis not present

## 2021-11-29 DIAGNOSIS — I10 Essential (primary) hypertension: Secondary | ICD-10-CM | POA: Diagnosis not present

## 2021-11-29 DIAGNOSIS — L309 Dermatitis, unspecified: Secondary | ICD-10-CM

## 2021-11-29 DIAGNOSIS — E119 Type 2 diabetes mellitus without complications: Secondary | ICD-10-CM

## 2021-11-29 LAB — COMPREHENSIVE METABOLIC PANEL
ALT: 22 U/L (ref 0–53)
AST: 21 U/L (ref 0–37)
Albumin: 4.6 g/dL (ref 3.5–5.2)
Alkaline Phosphatase: 65 U/L (ref 39–117)
BUN: 14 mg/dL (ref 6–23)
CO2: 26 mEq/L (ref 19–32)
Calcium: 9.4 mg/dL (ref 8.4–10.5)
Chloride: 105 mEq/L (ref 96–112)
Creatinine, Ser: 0.95 mg/dL (ref 0.40–1.50)
GFR: 83.44 mL/min (ref >60.00)
Glucose, Bld: 90 mg/dL (ref 70–99)
Potassium: 4.7 mEq/L (ref 3.5–5.1)
Sodium: 140 mEq/L (ref 135–145)
Total Bilirubin: 1.2 mg/dL (ref 0.2–1.2)
Total Protein: 7 g/dL (ref 6.0–8.3)

## 2021-11-29 LAB — LIPID PANEL
Cholesterol: 123 mg/dL (ref 0–200)
HDL: 41.7 mg/dL (ref >39.00)
LDL Cholesterol: 69 mg/dL (ref 0–99)
NonHDL: 81.59
Total CHOL/HDL Ratio: 3
Triglycerides: 65 mg/dL (ref 0.0–149.0)
VLDL: 13 mg/dL (ref 0.0–40.0)

## 2021-11-29 LAB — HEMOGLOBIN A1C: Hgb A1c MFr Bld: 5.6 % (ref 4.6–6.5)

## 2021-11-29 MED ORDER — MONTELUKAST SODIUM 10 MG PO TABS: 10.0000 mg | ORAL_TABLET | Freq: Every day | ORAL | 3 refills | Status: DC

## 2021-11-29 NOTE — Addendum Note (Signed)
Addended by: Gwenevere Abbot on: 11/29/2021 08:34 AM ? ? Modules accepted: Orders ? ?

## 2021-11-29 NOTE — Patient Instructions (Addendum)
Htn well controlled. Continue losartan 25 mg daily. ?  ?Diabetes. Check a1c. Continue metformin and may adjust treatment after lab. Check cmp and a1c. ?  ?High cholesterol. Continue lipitor and check lipid panel today. Counseled on 10 year cardiovascular score and reasoning behind using statin. ?  ?For allergies not ideally controlled use flonase and xyzal. But will add montelukast antihistamine. ? ?Let me know name of med dermatologist rx'd. Then I can let you know if can fill or if you need to see dermatologist. ?  ?Follow up date to be determined after lab review. Usually every 3 months with diabetes but if very well controlled could extend to 6 months. ?

## 2021-11-29 NOTE — Progress Notes (Signed)
? ?Subjective:  ? ? Patient ID: Nicholas Marks, male    DOB: 06-16-1956, 66 y.o.   MRN: [id number] ? ?HPI ?Pt has htn. On losartan 25 mg daily. Bp is well controlled today. ? ?  ?High cholesterol. Currently on statin. ?  ?  ?Diabetes continue low sugar diet. Noom diet which is probably diabetic friendly. On metformin and check a1c today. ? ? ?The 10-year ASCVD risk score (Arnett DK, et al., 2019) is: 25.1% ?  Values used to calculate the score: ?    Age: 48 years ?    Sex: Male ?    Is Non-Hispanic African American: No ?    Diabetic: Yes ?    Tobacco smoker: No ?    Systolic Blood Pressure: 326 mmHg ?    Is BP treated: Yes ?    HDL Cholesterol: 43 mg/dL ?    Total Cholesterol: 132 mg/dL  ?  ?  ?Allergies this time of the year. Pt feels constant nasal congestion. Since November and even now. Despite this he is still nasal congested.  ? ? ?Review of Systems  ?Constitutional:  Negative for chills, fatigue and fever.  ?HENT:  Negative for congestion.   ?Respiratory:  Negative for cough, chest tightness and stridor.   ?Cardiovascular:  Negative for chest pain and palpitations.  ?Gastrointestinal:  Negative for abdominal pain, constipation and nausea.  ?Genitourinary:  Negative for dysuria, frequency and testicular pain.  ?Musculoskeletal:  Negative for back pain and neck pain.  ?Skin:  Negative for rash.  ?Neurological:  Negative for dizziness, speech difficulty, weakness, light-headedness and headaches.  ?Hematological:  Negative for adenopathy. Does not bruise/bleed easily.  ?Psychiatric/Behavioral:  Negative for behavioral problems, confusion and dysphoric mood.   ? ? ?Past Medical History:  ?Diagnosis Date  ? Kidney stone   ? ?  ?Social History  ? ?Socioeconomic History  ? Marital status: Widowed  ?  Spouse name: Not on file  ? Number of children: Not on file  ? Years of education: Not on file  ? Highest education level: Not on file  ?Occupational History  ? Not on file  ?Tobacco Use  ? Smoking status: Former  ?   Packs/day: 1.50  ?  Years: 25.00  ?  Pack years: 37.50  ?  Types: Cigarettes  ?  Quit date: 06/21/2004  ?  Years since quitting: 17.4  ? Smokeless tobacco: Never  ?Vaping Use  ? Vaping Use: Former  ?Substance and Sexual Activity  ? Alcohol use: Yes  ?  Alcohol/week: 2.0 standard drinks  ?  Types: 2 Shots of liquor per week  ?  Comment: socially might go months  ? Drug use: Never  ? Sexual activity: Not on file  ?Other Topics Concern  ? Not on file  ?Social History Narrative  ? Not on file  ? ?Social Determinants of Health  ? ?Financial Resource Strain: Not on file  ?Food Insecurity: Not on file  ?Transportation Needs: Not on file  ?Physical Activity: Not on file  ?Stress: Not on file  ?Social Connections: Not on file  ?Intimate Partner Violence: Not on file  ? ? ?Past Surgical History:  ?Procedure Laterality Date  ? APPENDECTOMY    ? ? ?No family history on file. ? ?No Known Allergies ? ?Current Outpatient Medications on File Prior to Visit  ?Medication Sig Dispense Refill  ? atorvastatin (LIPITOR) 10 MG tablet TAKE 1 TABLET BY MOUTH DAILY 90 tablet 1  ? fluticasone (FLONASE) 50 MCG/ACT  nasal spray SHAKE LIQUID AND USE 2 SPRAYS IN EACH NOSTRIL DAILY 48 g 2  ? levocetirizine (XYZAL) 5 MG tablet TAKE 1 TABLET(5 MG) BY MOUTH EVERY EVENING 90 tablet 0  ? losartan (COZAAR) 25 MG tablet TAKE 1 TABLET BY MOUTH DAILY 90 tablet 1  ? metFORMIN (GLUCOPHAGE) 500 MG tablet TAKE 1 TABLET BY MOUTH TWICE DAILY 180 tablet 1  ? blood glucose meter kit and supplies KIT Dispense based on patient and insurance preference. Use up to four times daily as directed. (FOR ICD-9 250.00, 250.01). (Patient not taking: Reported on 11/29/2021) 1 each 0  ? Blood Glucose Monitoring Suppl (ONETOUCH VERIO) w/Device KIT Use as directed to check sugar 3 times a day.  Dx Code: E11.9 (Patient not taking: Reported on 11/29/2021) 1 kit 0  ? glucose blood (ONETOUCH VERIO) test strip Use as instructed (Patient not taking: Reported on 11/29/2021) 300 each 1  ?  loratadine-pseudoephedrine (CLARITIN-D 24-HOUR) 10-240 MG 24 hr tablet Take 1 tablet by mouth daily as needed for allergies. (Patient not taking: Reported on 11/29/2021)    ? OneTouch Delica Lancets 56P MISC Use as directed 3 times a day.  Dx Code E11.9 (Patient not taking: Reported on 11/29/2021) 300 each 1  ? ?No current facility-administered medications on file prior to visit.  ? ? ?BP 132/68   Pulse 78   Temp 98 ?F (36.7 ?C) (Oral)   Ht _0  (1.727 m)   Wt 236 lb 3.2 oz (107.1 kg)   SpO2 96%   BMI 35.91 kg/m?  ?  ?   ?Objective:  ? Physical Exam ? ?General ?Mental Status- Alert. General Appearance- Not in acute distress.  ? ?Skin ?General: Color- Normal Color. Moisture- Normal Moisture. ? ?Neck ?Carotid Arteries- Normal color. Moisture- Normal Moisture. No carotid bruits. No JVD. ? ?Chest and Lung Exam ?Auscultation: ?Breath Sounds:-Normal. ? ?Cardiovascular ?Auscultation:Rythm- Regular. ?Murmurs & Other Heart Sounds:Auscultation of the heart reveals- No Murmurs. ? ?Abdomen ?Inspection:-Inspeection Normal. ?Palpation/Percussion:Note:No mass. Palpation and Percussion of the abdomen reveal- Non Tender, Non Distended + BS, no rebound or guarding. ? ? ?Neurologic ?Cranial Nerve exam:- CN III-XII intact(No nystagmus), symmetric smile. ?Strength:- 5/5 equal and symmetric strength both upper and lower extremities.  ? ?Heent- no frontal sinus pressure or maxillary. Boggy turbinates. +pnd. ?   ?Assessment & Plan:  ? ?Patient Instructions  ?Htn well controlled. Continue losartan 25 mg daily. ?  ?Diabetes. Check a1c. Continue metformin and may adjust treatment after lab. Check cmp and a1c. ?  ?High cholesterol. Continue lipitor and check lipid panel today. Counseled on 10 year cardiovascular score and reasoning behind using statin. ?  ?For allergies not ideally controlled use flonase and xyzal. But will add montelukast antihistamine. ? ?Let me know name of med dermatologist rx'd. Then I can let you know if can fill or  if you need to see dermatologist. ?  ?Follow up date to be determined after lab review. Usually every 3 months with diabetes but if very well controlled could extend to 6 months.  ? ? ?Mackie Pai, PA-C  ?

## 2021-12-08 ENCOUNTER — Encounter: Payer: Self-pay | Admitting: Medical

## 2021-12-09 MED ORDER — TRIAMCINOLONE ACETONIDE 0.1 % EX CREA: 1.0000 "application " | TOPICAL_CREAM | Freq: Two times a day (BID) | CUTANEOUS | 2 refills | Status: DC

## 2021-12-09 NOTE — Addendum Note (Signed)
Addended by: Gwenevere Abbot on: 12/09/2021 08:38 AM   Modules accepted: Orders

## 2021-12-12 ENCOUNTER — Other Ambulatory Visit: Payer: Self-pay | Admitting: Medical

## 2022-01-16 ENCOUNTER — Telehealth: Payer: Self-pay | Admitting: Medical

## 2022-01-16 NOTE — Telephone Encounter (Signed)
Left message for patient to call back and schedule Medicare Annual Wellness Visit (AWV).   Please offer to do virtually or by telephone.  Left office number and my jabber [phone].  AWVI eliigible as of 05/21/2021  Please schedule at anytime with Nurse Health Advisor.

## 2022-04-01 ENCOUNTER — Other Ambulatory Visit: Payer: Self-pay | Admitting: Medical

## 2022-04-01 MED ORDER — ATORVASTATIN CALCIUM 10 MG PO TABS: 10.0000 mg | ORAL_TABLET | Freq: Every day | ORAL | 1 refills | Status: DC

## 2022-05-15 ENCOUNTER — Other Ambulatory Visit: Payer: Self-pay | Admitting: Medical

## 2022-06-06 ENCOUNTER — Ambulatory Visit (INDEPENDENT_AMBULATORY_CARE_PROVIDER_SITE_OTHER): Payer: Medicare Other | Admitting: Medical

## 2022-06-06 ENCOUNTER — Encounter: Payer: Self-pay | Admitting: Medical

## 2022-06-06 VITALS — BP 118/80 | HR 74 | Temp 97.9°F | Resp 18 | Ht 68.0 in | Wt 254.4 lb

## 2022-06-06 DIAGNOSIS — E119 Type 2 diabetes mellitus without complications: Secondary | ICD-10-CM | POA: Diagnosis not present

## 2022-06-06 DIAGNOSIS — Z87891 Personal history of nicotine dependence: Secondary | ICD-10-CM

## 2022-06-06 DIAGNOSIS — E875 Hyperkalemia: Secondary | ICD-10-CM

## 2022-06-06 DIAGNOSIS — E785 Hyperlipidemia, unspecified: Secondary | ICD-10-CM | POA: Diagnosis not present

## 2022-06-06 DIAGNOSIS — Z23 Encounter for immunization: Secondary | ICD-10-CM | POA: Diagnosis not present

## 2022-06-06 DIAGNOSIS — I10 Essential (primary) hypertension: Secondary | ICD-10-CM | POA: Diagnosis not present

## 2022-06-06 LAB — LIPID PANEL
Cholesterol: 126 mg/dL (ref 0–200)
HDL: 39.4 mg/dL (ref >39.00)
LDL Cholesterol: 63 mg/dL (ref 0–99)
NonHDL: 86.4
Total CHOL/HDL Ratio: 3
Triglycerides: 118 mg/dL (ref 0.0–149.0)
VLDL: 23.6 mg/dL (ref 0.0–40.0)

## 2022-06-06 LAB — COMPREHENSIVE METABOLIC PANEL
ALT: 31 U/L (ref 0–53)
AST: 32 U/L (ref 0–37)
Albumin: 4.7 g/dL (ref 3.5–5.2)
Alkaline Phosphatase: 60 U/L (ref 39–117)
BUN: 15 mg/dL (ref 6–23)
CO2: 29 mEq/L (ref 19–32)
Calcium: 9.2 mg/dL (ref 8.4–10.5)
Chloride: 105 mEq/L (ref 96–112)
Creatinine, Ser: 0.86 mg/dL (ref 0.40–1.50)
GFR: 89.93 mL/min (ref >60.00)
Glucose, Bld: 89 mg/dL (ref 70–99)
Potassium: 5.4 mEq/L — ABNORMAL HIGH (ref 3.5–5.1)
Sodium: 139 mEq/L (ref 135–145)
Total Bilirubin: 1 mg/dL (ref 0.2–1.2)
Total Protein: 7.3 g/dL (ref 6.0–8.3)

## 2022-06-06 LAB — HEMOGLOBIN A1C: Hgb A1c MFr Bld: 6 % (ref 4.6–6.5)

## 2022-06-06 MED ORDER — LOSARTAN POTASSIUM 25 MG PO TABS: 25.0000 mg | ORAL_TABLET | Freq: Every day | ORAL | 1 refills | Status: DC

## 2022-06-06 MED ORDER — MONTELUKAST SODIUM 10 MG PO TABS
10.0000 mg | ORAL_TABLET | Freq: Every day | ORAL | 11 refills | Status: DC
Start: 2022-06-06 — End: 2022-12-05

## 2022-06-06 MED ORDER — METFORMIN HCL 500 MG PO TABS: 500.0000 mg | ORAL_TABLET | Freq: Two times a day (BID) | ORAL | 1 refills | Status: DC

## 2022-06-06 NOTE — Progress Notes (Signed)
Subjective:    Patient ID: Nicholas Marks, male    DOB: [birth year], 66 y.o.   MRN: [id number]  HPI  Pt in for follow up.  Pt needs shingrix vaccine. He got first vaccine but never got second.  Pt also has been thinking about covid vaccine. He decided not to get further.  Pt up to date on pneumonia vaccines.  Pt will get flu vaccines today.   Hx of smoking- 25 years. 1.5 pack a day.  Pt has htn. On losartan 25 mg daily. Bp is well controlled today.   High cholesterol. Currently on statin.    Diabetes- on metformin. Pt has not been checking his sugars daily.     Review of Systems  Constitutional:  Negative for chills, fatigue and fever.  Respiratory:  Negative for cough, chest tightness, shortness of breath and wheezing.   Cardiovascular:  Negative for chest pain and palpitations.  Gastrointestinal:  Negative for abdominal pain and blood in stool.  Genitourinary:  Negative for dysuria, flank pain, frequency and genital sores.  Musculoskeletal:  Negative for back pain and joint swelling.  Skin:  Negative for rash.  Neurological:  Negative for dizziness, seizures, weakness, numbness and headaches.  Hematological:  Negative for adenopathy. Does not bruise/bleed easily.  Psychiatric/Behavioral:  Negative for behavioral problems, decreased concentration, hallucinations and sleep disturbance. The patient is not nervous/anxious.     Past Medical History:  Diagnosis Date   Kidney stone      Social History   Socioeconomic History   Marital status: Widowed    Spouse name: Not on file   Number of children: Not on file   Years of education: Not on file   Highest education level: Not on file  Occupational History   Not on file  Tobacco Use   Smoking status: Former    Packs/day: 1.50    Years: 25.00    Total pack years: 37.50    Types: Cigarettes    Quit date: 06/21/2004    Years since quitting: 17.9   Smokeless tobacco: Never  Vaping Use   Vaping Use: Former   Substance and Sexual Activity   Alcohol use: Yes    Alcohol/week: 2.0 standard drinks of alcohol    Types: 2 Shots of liquor per week    Comment: socially might go months   Drug use: Never   Sexual activity: Not on file  Other Topics Concern   Not on file  Social History Narrative   Not on file   Social Determinants of Health   Financial Resource Strain: Not on file  Food Insecurity: Not on file  Transportation Needs: Not on file  Physical Activity: Not on file  Stress: Not on file  Social Connections: Not on file  Intimate Partner Violence: Not on file    Past Surgical History:  Procedure Laterality Date   APPENDECTOMY      No family history on file.  No Known Allergies  Current Outpatient Medications on File Prior to Visit  Medication Sig Dispense Refill   atorvastatin (LIPITOR) 10 MG tablet Take 1 tablet (10 mg total) by mouth daily. 90 tablet 1   fluticasone (FLONASE) 50 MCG/ACT nasal spray SHAKE LIQUID AND USE 2 SPRAYS IN EACH NOSTRIL DAILY 48 g 2   levocetirizine (XYZAL) 5 MG tablet Take 1 tablet (5 mg total) by mouth every evening. 90 tablet 0   losartan (COZAAR) 25 MG tablet TAKE 1 TABLET BY MOUTH DAILY 90 tablet 1   metFORMIN (  GLUCOPHAGE) 500 MG tablet TAKE 1 TABLET BY MOUTH TWICE DAILY 180 tablet 1   montelukast (SINGULAIR) 10 MG tablet Take 1 tablet (10 mg total) by mouth at bedtime. 30 tablet 3   triamcinolone cream (KENALOG) 0.1 % Apply 1 application. topically 2 (two) times daily. 30 g 2   blood glucose meter kit and supplies KIT Dispense based on patient and insurance preference. Use up to four times daily as directed. (FOR ICD-9 250.00, 250.01). (Patient not taking: Reported on 11/29/2021) 1 each 0   Blood Glucose Monitoring Suppl (ONETOUCH VERIO) w/Device KIT Use as directed to check sugar 3 times a day.  Dx Code: E11.9 (Patient not taking: Reported on 11/29/2021) 1 kit 0   glucose blood (ONETOUCH VERIO) test strip Use as instructed (Patient not taking:  Reported on 11/29/2021) 300 each 1   loratadine-pseudoephedrine (CLARITIN-D 24-HOUR) 10-240 MG 24 hr tablet Take 1 tablet by mouth daily as needed for allergies. (Patient not taking: Reported on 11/29/2021)     OneTouch Delica Lancets 69S MISC Use as directed 3 times a day.  Dx Code E11.9 (Patient not taking: Reported on 11/29/2021) 300 each 1   No current facility-administered medications on file prior to visit.    BP 118/80 (BP Location: Left Arm, Patient Position: Sitting, Cuff Size: Large)   Pulse 74   Temp 97.9 F (36.6 C) (Oral)   Resp 18   Ht _0  (1.727 m)   Wt 254 lb 6.4 oz (115.4 kg)   SpO2 98%   BMI 38.68 kg/m        Objective:   Physical Exam  General Mental Status- Alert. General Appearance- Not in acute distress.   Skin General: Color- Normal Color. Moisture- Normal Moisture.  Neck Carotid Arteries- Normal color. Moisture- Normal Moisture. No carotid bruits. No JVD.  Chest and Lung Exam Auscultation: Breath Sounds:-Normal.  Cardiovascular Auscultation:Rythm- Regular. Murmurs & Other Heart Sounds:Auscultation of the heart reveals- No Murmurs.  Abdomen Inspection:-Inspeection Normal. Palpation/Percussion:Note:No mass. Palpation and Percussion of the abdomen reveal- Non Tender, Non Distended + BS, no rebound or guarding.   Neurologic Cranial Nerve exam:- CN III-XII intact(No nystagmus), symmetric smile. Strength:- 5/5 equal and symmetric strength both upper and lower extremities.       Assessment & Plan:   Htn well controlled. Continue losartan 25 mg daily.   Diabetes. Check a1c. Continue metformin and may adjust treatment after lab. Check cmp and a1c.   High cholesterol. Continue lipitor and check lipid panel today. Counseled on 10 year cardiovascular score and reasoning behind using statin.   For allergies not ideally controlled use flonase and xyzal. But will add montelukast antihistamine.   Placed referral to pulmonlologist to see if you need  screening ct chest.  Also placed referral to cardiologist to discuss past ct chest findings and degree of treatment needed.  Follow up date to be determined after lab review. Usually every 3 months with diabetes but if very well controlled could extend to 6 months.   Shingrix vaccine today.  Mackie Pai, PA-C    Time spent with patient today was 40 minutes which consisted of chart review, discussing diagnosis, work up, treatment and documentation.

## 2022-06-06 NOTE — Addendum Note (Signed)
Addended by: Roxanne Gates on: 06/06/2022 10:01 AM   Modules accepted: Orders

## 2022-06-06 NOTE — Patient Instructions (Addendum)
Htn well controlled. Continue losartan 25 mg daily.   Diabetes. Check a1c. Continue metformin and may adjust treatment after lab. Check cmp and a1c.   High cholesterol. Continue lipitor and check lipid panel today. Counseled on 10 year cardiovascular score and reasoning behind using statin.   For allergies not ideally controlled use flonase and xyzal. But will add montelukast antihistamine.   Placed referral to pulmonlologist to see if you need screening ct chest.  Also placed referral to cardiologist to discuss past ct chest findings and degree of treatment needed.  Follow up date to be determined after lab review. Usually every 3 months with diabetes but if very well controlled could extend to 6 months.   Shingrix vaccine today.  Labs today with urine microalbumin.

## 2022-06-07 LAB — MICROALBUMIN, URINE: Microalb, Ur: 0.6 mg/dL

## 2022-06-07 MED ORDER — SODIUM POLYSTYRENE SULFONATE 15 GM/60ML PO SUSP: ORAL | 0 refills | Status: DC

## 2022-06-07 NOTE — Addendum Note (Signed)
Addended by: Gwenevere Abbot on: 06/07/2022 01:49 PM   Modules accepted: Orders

## 2022-06-09 ENCOUNTER — Ambulatory Visit: Payer: Medicare Other | Attending: Cardiology | Admitting: Cardiology

## 2022-06-09 VITALS — BP 166/77 | HR 81 | Ht 68.0 in | Wt 253.1 lb

## 2022-06-09 DIAGNOSIS — K5790 Diverticulosis of intestine, part unspecified, without perforation or abscess without bleeding: Secondary | ICD-10-CM | POA: Insufficient documentation

## 2022-06-09 DIAGNOSIS — E669 Obesity, unspecified: Secondary | ICD-10-CM | POA: Insufficient documentation

## 2022-06-09 DIAGNOSIS — R011 Cardiac murmur, unspecified: Secondary | ICD-10-CM | POA: Diagnosis present

## 2022-06-09 DIAGNOSIS — N200 Calculus of kidney: Secondary | ICD-10-CM | POA: Insufficient documentation

## 2022-06-09 DIAGNOSIS — E109 Type 1 diabetes mellitus without complications: Secondary | ICD-10-CM | POA: Diagnosis present

## 2022-06-09 DIAGNOSIS — R030 Elevated blood-pressure reading, without diagnosis of hypertension: Secondary | ICD-10-CM | POA: Insufficient documentation

## 2022-06-09 HISTORY — DX: Diverticulosis of intestine, part unspecified, without perforation or abscess without bleeding: K57.90

## 2022-06-09 NOTE — Patient Instructions (Signed)
Medication Instructions:  Your physician recommends that you continue on your current medications as directed. Please refer to the Current Medication list given to you today.  *If you need a refill on your cardiac medications before your next appointment, please call your pharmacy*   Lab Work: None ordered If you have labs (blood work) drawn today and your tests are completely normal, you will receive your results only by: MyChart Message (if you have MyChart) OR A paper copy in the mail If you have any lab test that is abnormal or we need to change your treatment, we will call you to review the results.   Testing/Procedures: Your physician has requested that you have an echocardiogram. Echocardiography is a painless test that uses sound waves to create images of your heart. It provides your doctor with information about the size and shape of your heart and how well your heart's chambers and valves are working. This procedure takes approximately one hour. There are no restrictions for this procedure.  We will order CT coronary calcium score. It will cost $99.00 and is not covered by insurance.  Please call to schedule.    [facility] [place] [address] [place], [place] [address] [phone]   Follow-Up: At [facility], you and your health needs are our priority.  As part of our continuing mission to provide you with exceptional heart care, we have created designated Provider Care Teams.  These Care Teams include your primary Cardiologist (physician) and Advanced Practice Providers (APPs -  Physician Assistants and Nurse Practitioners) who all work together to provide you with the care you need, when you need it.  We recommend signing up for the patient portal called "MyChart".  Sign up information is provided on this After Visit Summary.  MyChart is used to connect with patients for Virtual Visits (Telemedicine).  Patients are able to view lab/test results, encounter  notes, upcoming appointments, etc.  Non-urgent messages can be sent to your provider as well.   To learn more about what you can do with MyChart, go to [email or web address].    Your next appointment:   12 month(s)  The format for your next appointment:   In Person  Provider:   Rajan Revankar, MD   Other Instructions Echocardiogram An echocardiogram is a test that uses sound waves (ultrasound) to produce images of the heart. Images from an echocardiogram can provide important information about: Heart size and shape. The size and thickness and movement of your heart's walls. Heart muscle function and strength. Heart valve function or if you have stenosis. Stenosis is when the heart valves are too narrow. If blood is flowing backward through the heart valves (regurgitation). A tumor or infectious growth around the heart valves. Areas of heart muscle that are not working well because of poor blood flow or injury from a heart attack. Aneurysm detection. An aneurysm is a weak or damaged part of an artery wall. The wall bulges out from the normal force of blood pumping through the body. Tell a health care provider about: Any allergies you have. All medicines you are taking, including vitamins, herbs, eye drops, creams, and over-the-counter medicines. Any blood disorders you have. Any surgeries you have had. Any medical conditions you have. Whether you are pregnant or may be pregnant. What are the risks? Generally, this is a safe test. However, problems may occur, including an allergic reaction to dye (contrast) that may be used during the test. What happens before the test? No specific   preparation is needed. You may eat and drink normally. What happens during the test? You will take off your clothes from the waist up and put on a hospital gown. Electrodes or electrocardiogram (ECG)patches may be placed on your chest. The electrodes or patches are then connected to a device that  monitors your heart rate and rhythm. You will lie down on a table for an ultrasound exam. A gel will be applied to your chest to help sound waves pass through your skin. A handheld device, called a transducer, will be pressed against your chest and moved over your heart. The transducer produces sound waves that travel to your heart and bounce back (or "echo" back) to the transducer. These sound waves will be captured in real-time and changed into images of your heart that can be viewed on a video monitor. The images will be recorded on a computer and reviewed by your health care provider. You may be asked to change positions or hold your breath for a short time. This makes it easier to get different views or better views of your heart. In some cases, you may receive contrast through an IV in one of your veins. This can improve the quality of the pictures from your heart. The procedure may vary among health care providers and hospitals.   What can I expect after the test? You may return to your normal, everyday life, including diet, activities, and medicines, unless your health care provider tells you not to do that. Follow these instructions at home: It is up to you to get the results of your test. Ask your health care provider, or the department that is doing the test, when your results will be ready. Keep all follow-up visits. This is important. Summary An echocardiogram is a test that uses sound waves (ultrasound) to produce images of the heart. Images from an echocardiogram can provide important information about the size and shape of your heart, heart muscle function, heart valve function, and other possible heart problems. You do not need to do anything to prepare before this test. You may eat and drink normally. After the echocardiogram is completed, you may return to your normal, everyday life, unless your health care provider tells you not to do that. This information is not intended to  replace advice given to you by your health care provider. Make sure you discuss any questions you have with your health care provider. Document Revised: 02/28/2020 Document Reviewed: 02/28/2020 Elsevier Patient Education  2021 Elsevier Inc.  Coronary Calcium Scan A coronary calcium scan is an imaging test used to look for deposits of plaque in the inner lining of the blood vessels of the heart (coronary arteries). Plaque is made up of calcium, protein, and fatty substances. These deposits of plaque can partly clog and narrow the coronary arteries without producing any symptoms or warning signs. This puts a person at risk for a heart attack. A coronary calcium scan is performed using a computed tomography (CT) scanner machine without using a dye (contrast). This test is recommended for people who are at moderate risk for heart disease. The test can find plaque deposits before symptoms develop. Tell a health care provider about: Any allergies you have. All medicines you are taking, including vitamins, herbs, eye drops, creams, and over-the-counter medicines. Any problems you or family members have had with anesthetic medicines. Any bleeding problems you have. Any surgeries you have had. Any medical conditions you have. Whether you are pregnant or may be pregnant. What   are the risks? Generally, this is a safe procedure. However, problems may occur, including: Harm to a pregnant woman and her unborn baby. This test involves the use of radiation. Radiation exposure can be dangerous to a pregnant woman and her unborn baby. If you are pregnant or think you may be pregnant, you should not have this procedure done. A slight increase in the risk of cancer. This is because of the radiation involved in the test. The amount of radiation from one test is similar to the amount of radiation you are naturally exposed to over one year. What happens before the procedure? Ask your health care provider for any  specific instructions on how to prepare for this procedure. You may be asked to avoid products that contain caffeine, tobacco, or nicotine for 4 hours before the procedure. What happens during the procedure?  You will undress and remove any jewelry from your neck or chest. You may need to remove hearing aides and dentures. Women may need to remove their bras. You will put on a hospital gown. Sticky electrodes will be placed on your chest. The electrodes will be connected to an electrocardiogram (ECG) machine to record a tracing of the electrical activity of your heart. You will lie down on your back on a curved bed that is attached to the CT scanner. You may be given medicine to slow down your heart rate so that clear pictures can be created. You will be moved into the CT scanner, and the CT scanner will take pictures of your heart. During this time, you will be asked to lie still and hold your breath for 10-20 seconds at a time while each picture of your heart is being taken. The procedure may vary among health care providers and hospitals. What can I expect after the procedure? You can return to your normal activities. It is up to you to get the results of your procedure. Ask your health care provider, or the department that is doing the procedure, when your results will be ready. Summary A coronary calcium scan is an imaging test used to look for deposits of plaque in the inner lining of the blood vessels of the heart. Plaque is made up of calcium, protein, and fatty substances. A coronary calcium scan is performed using a CT scanner machine without contrast. Generally, this is a safe procedure. Tell your health care provider if you are pregnant or may be pregnant. Ask your health care provider for any specific instructions on how to prepare for this procedure. You can return to your normal activities after the scan is done. This information is not intended to replace advice given to you by  your health care provider. Make sure you discuss any questions you have with your health care provider. Document Revised: 06/16/2021 Document Reviewed: 06/16/2021 Elsevier Patient Education  2023 Elsevier Inc.  

## 2022-06-09 NOTE — Progress Notes (Signed)
Cardiology Office Note:    Date:  06/09/2022   ID:  Nicholas Marks, DOB May 03, 1956, MRN [id number]  PCP:  Esperanza Richters, PA-C  Cardiologist:  Garwin Brothers, MD   Referring MD: Esperanza Richters, PA-C    ASSESSMENT:    1. Elevated blood pressure reading in office with white coat syndrome, without diagnosis of hypertension   2. Type 1 diabetes mellitus without complication (HCC)   3. Murmur, cardiac   4. Obesity (BMI 35.0-39.9 without comorbidity)    PLAN:    In order of problems listed above:  Primary prevention stressed with the patient.  Importance of compliance with diet and medication stressed any vocalized understanding.  He was advised to walk at least half an hour a day 5 days a week and he promises to do so. Obesity: Weight reduction stressed diet emphasized.  Risks of obesity explained and he promises to do better. Cardiac murmur: Echocardiogram will be done to assess murmur heard on auscultation. Diabetes mellitus: Managed by primary care.  Diet emphasized. Statin therapy: Told him that it is a national guideline to have on board statin therapy for diabetes.  To help him in his 28s stratification I will do a calcium scoring and he is agreeable.  Further recommendations will be made based on the findings of those test.  I reviewed his lipids and they are fine. Patient will be seen in follow-up appointment in 12 months or earlier if the patient has any concerns    Medication Adjustments/Labs and Tests Ordered: Current medicines are reviewed at length with the patient today.  Concerns regarding medicines are outlined above.  Orders Placed This Encounter  Procedures   CT CARDIAC SCORING   EKG 12-Lead   ECHOCARDIOGRAM COMPLETE   No orders of the defined types were placed in this encounter.    History of Present Illness:    Nicholas Marks is a 66 y.o. male who is being seen today for the evaluation of appropriateness of statin therapy at the request of [place],  Nicholas Marks, [place].  Patient is a pleasant 66 year old male.  He has past medical history of diabetes mellitus and obesity.  He leads a sedentary lifestyle.  He has no history of chest pain orthopnea or PND.  He is initiated on statin therapy and is concerned about it and had questions and wanted an evaluation.  At the time of my evaluation, the patient is alert awake oriented and in no distress.  Past Medical History:  Diagnosis Date   Diverticulosis 06/09/2022   Kidney stone    Routine health maintenance 12/04/2011    Past Surgical History:  Procedure Laterality Date   APPENDECTOMY      Current Medications: Current Meds  Medication Sig   atorvastatin (LIPITOR) 10 MG tablet Take 1 tablet (10 mg total) by mouth daily.   fluticasone (FLONASE) 50 MCG/ACT nasal spray SHAKE LIQUID AND USE 2 SPRAYS IN EACH NOSTRIL DAILY   levocetirizine (XYZAL) 5 MG tablet Take 1 tablet (5 mg total) by mouth every evening.   losartan (COZAAR) 25 MG tablet Take 1 tablet (25 mg total) by mouth daily.   metFORMIN (GLUCOPHAGE) 500 MG tablet Take 1 tablet (500 mg total) by mouth 2 (two) times daily.   montelukast (SINGULAIR) 10 MG tablet Take 1 tablet (10 mg total) by mouth at bedtime.   sodium polystyrene (KAYEXALATE) 15 GM/60ML suspension 60 ml po x 4 days   triamcinolone cream (KENALOG) 0.1 % Apply 1 application. topically 2 (two) times  daily.     Allergies:   Patient has no known allergies.   Social History   Socioeconomic History   Marital status: Widowed    Spouse name: Not on file   Number of children: Not on file   Years of education: Not on file   Highest education level: Not on file  Occupational History   Not on file  Tobacco Use   Smoking status: Former    Packs/day: 1.50    Years: 25.00    Total pack years: 37.50    Types: Cigarettes    Quit date: 06/21/2004    Years since quitting: 17.9   Smokeless tobacco: Never  Vaping Use   Vaping Use: Former  Substance and Sexual Activity   Alcohol  use: Yes    Alcohol/week: 2.0 standard drinks of alcohol    Types: 2 Shots of liquor per week    Comment: socially might go months   Drug use: Never   Sexual activity: Not on file  Other Topics Concern   Not on file  Social History Narrative   Not on file   Social Determinants of Health   Financial Resource Strain: Not on file  Food Insecurity: Not on file  Transportation Needs: Not on file  Physical Activity: Not on file  Stress: Not on file  Social Connections: Not on file     Family History: The patient's family history is negative for Hypertension, Diabetes, Heart disease, and Cancer.  ROS:   Please see the history of present illness.    All other systems reviewed and are negative.  EKGs/Labs/Other Studies Reviewed:    The following studies were reviewed today: EKG reveals sinus rhythm and nonspecific ST-T changes   Recent Labs: 06/06/2022: ALT 31; BUN 15; Creatinine, Ser 0.86; Potassium 5.4 Hemolysis seen...; Sodium 139  Recent Lipid Panel    Component Value Date/Time   CHOL 126 06/06/2022 0949   TRIG 118.0 06/06/2022 0949   HDL 39.40 06/06/2022 0949   CHOLHDL 3 06/06/2022 0949   VLDL 23.6 06/06/2022 0949   LDLCALC 63 06/06/2022 0949    Physical Exam:    VS:  BP (!) 166/77   Pulse 81   Ht 5\' 8"  (1.727 m)   Wt 253 lb 1.3 oz (114.8 kg)   SpO2 97%   BMI 38.48 kg/m     Wt Readings from Last 3 Encounters:  06/09/22 253 lb 1.3 oz (114.8 kg)  06/06/22 254 lb 6.4 oz (115.4 kg)  11/29/21 236 lb 3.2 oz (107.1 kg)     GEN: Patient is in no acute distress HEENT: Normal NECK: No JVD; No carotid bruits LYMPHATICS: No lymphadenopathy CARDIAC: S1 S2 regular, 2/6 systolic murmur at the apex. RESPIRATORY:  Clear to auscultation without rales, wheezing or rhonchi  ABDOMEN: Soft, non-tender, non-distended MUSCULOSKELETAL:  No edema; No deformity  SKIN: Warm and dry NEUROLOGIC:  Alert and oriented x 3 PSYCHIATRIC:  Normal affect    Signed, 01/29/22, MD  06/09/2022 9:36 AM    [facility]

## 2022-06-10 ENCOUNTER — Ambulatory Visit (HOSPITAL_BASED_OUTPATIENT_CLINIC_OR_DEPARTMENT_OTHER)
Admission: RE | Admit: 2022-06-10 | Discharge: 2022-06-10 | Disposition: A | Discharge status: Home / Self Care | Payer: Medicare Other | Source: Ambulatory Visit | Attending: Cardiology | Admitting: Cardiology

## 2022-06-10 DIAGNOSIS — R030 Elevated blood-pressure reading, without diagnosis of hypertension: Secondary | ICD-10-CM | POA: Insufficient documentation

## 2022-06-10 DIAGNOSIS — R011 Cardiac murmur, unspecified: Secondary | ICD-10-CM | POA: Insufficient documentation

## 2022-06-10 DIAGNOSIS — E109 Type 1 diabetes mellitus without complications: Secondary | ICD-10-CM | POA: Insufficient documentation

## 2022-06-11 ENCOUNTER — Telehealth: Payer: Self-pay

## 2022-06-11 DIAGNOSIS — R931 Abnormal findings on diagnostic imaging of heart and coronary circulation: Secondary | ICD-10-CM

## 2022-06-11 DIAGNOSIS — I2510 Atherosclerotic heart disease of native coronary artery without angina pectoris: Secondary | ICD-10-CM

## 2022-06-11 NOTE — Telephone Encounter (Signed)
Results reviewed with pt as per Dr. Kem Parkinson note.  Pt verbalized understanding and had no additional questions. Routed to PCP.  Stress Echo Instructions reviewed with the pt and sent in MyChart.      Stress Echocardiogram Information Sheet                                                      Instructions:    1. You may take your morning medications the morning of the test  2. Light breakfast.  3. Dress prepared to exercise.  4. DO NOT use ANY caffeine or tobacco products 3 hours before appointment.  5. Please bring all current prescription medications.

## 2022-06-11 NOTE — Telephone Encounter (Signed)
-----   Message from Garwin Brothers, MD sent at 06/10/2022  5:01 PM EST ----- Markedly elevated calcium score.  He needs to diet and exercise.  Please get him in for exercise stress echo.  Copy primary care. Garwin Brothers, MD 06/10/2022 5:01 PM

## 2022-06-25 ENCOUNTER — Ambulatory Visit (INDEPENDENT_AMBULATORY_CARE_PROVIDER_SITE_OTHER): Payer: Medicare Other

## 2022-06-25 VITALS — Wt 247.0 lb

## 2022-06-25 DIAGNOSIS — Z0000 Encounter for general adult medical examination without abnormal findings: Secondary | ICD-10-CM

## 2022-06-25 NOTE — Patient Instructions (Addendum)
[name] , Thank you for taking time to come for your Medicare Wellness Visit. I appreciate your ongoing commitment to your health goals. Please review the following plan we discussed and let me know if I can assist you in the future.   These are the goals we discussed:  Goals      Patient Stated     Lose weight         This is a list of the screening recommended for you and due dates:  Health Maintenance  Topic Date Due   Complete foot exam   Never done   Eye exam for diabetics  Never done   Yearly kidney health urinalysis for diabetes  Never done   Hepatitis C Screening: USPSTF Recommendation to screen - Ages 64-79 yo.  Never done   DTaP/Tdap/Td vaccine (1 - Tdap) Never done   Screening for Lung Cancer  08/10/2020   COVID-19 Vaccine (3 - 2023-24 season) 03/21/2022   Hemoglobin A1C  12/05/2022   Yearly kidney function blood test for diabetes  06/07/2023   Medicare Annual Wellness Visit  06/26/2023   Cologuard (Stool DNA test)  09/20/2023   Pneumonia Vaccine  Completed   Flu Shot  Completed   Zoster (Shingles) Vaccine  Completed   HPV Vaccine  Aged Out    Advanced directives: Please bring a copy of your health care power of attorney and living will to the office at your convenience.  Conditions/risks identified: lose weight   Next appointment: Follow up in one year for your annual wellness visit.   Preventive Care 37 Years and Older, Male  Preventive care refers to lifestyle choices and visits with your health care provider that can promote health and wellness. What does preventive care include? A yearly physical exam. This is also called an annual well check. Dental exams once or twice a year. Routine eye exams. Ask your health care provider how often you should have your eyes checked. Personal lifestyle choices, including: Daily care of your teeth and gums. Regular physical activity. Eating a healthy diet. Avoiding tobacco and drug use. Limiting alcohol  use. Practicing safe sex. Taking low doses of aspirin every day. Taking vitamin and mineral supplements as recommended by your health care provider. What happens during an annual well check? The services and screenings done by your health care provider during your annual well check will depend on your age, overall health, lifestyle risk factors, and family history of disease. Counseling  Your health care provider may ask you questions about your: Alcohol use. Tobacco use. Drug use. Emotional well-being. Home and relationship well-being. Sexual activity. Eating habits. History of falls. Memory and ability to understand (cognition). Work and work Astronomer. Screening  You may have the following tests or measurements: Height, weight, and BMI. Blood pressure. Lipid and cholesterol levels. These may be checked every 5 years, or more frequently if you are over 72 years old. Skin check. Lung cancer screening. You may have this screening every year starting at age 33 if you have a 30-pack-year history of smoking and currently smoke or have quit within the past 15 years. Fecal occult blood test (FOBT) of the stool. You may have this test every year starting at age 65. Flexible sigmoidoscopy or colonoscopy. You may have a sigmoidoscopy every 5 years or a colonoscopy every 10 years starting at age 37. Prostate cancer screening. Recommendations will vary depending on your family history and other risks. Hepatitis C blood test. Hepatitis B blood test. Sexually  transmitted disease (STD) testing. Diabetes screening. This is done by checking your blood sugar (glucose) after you have not eaten for a while (fasting). You may have this done every 1-3 years. Abdominal aortic aneurysm (AAA) screening. You may need this if you are a current or former smoker. Osteoporosis. You may be screened starting at age 67 if you are at high risk. Talk with your health care provider about your test results,  treatment options, and if necessary, the need for more tests. Vaccines  Your health care provider may recommend certain vaccines, such as: Influenza vaccine. This is recommended every year. Tetanus, diphtheria, and acellular pertussis (Tdap, Td) vaccine. You may need a Td booster every 10 years. Zoster vaccine. You may need this after age 103. Pneumococcal 13-valent conjugate (PCV13) vaccine. One dose is recommended after age 69. Pneumococcal polysaccharide (PPSV23) vaccine. One dose is recommended after age 36. Talk to your health care provider about which screenings and vaccines you need and how often you need them. This information is not intended to replace advice given to you by your health care provider. Make sure you discuss any questions you have with your health care provider. Document Released: 08/03/2015 Document Revised: 03/26/2016 Document Reviewed: 05/08/2015 Elsevier Interactive Patient Education  2017 [place] Prevention in the Home Falls can cause injuries. They can happen to people of all ages. There are many things you can do to make your home safe and to help prevent falls. What can I do on the outside of my home? Regularly fix the edges of walkways and driveways and fix any cracks. Remove anything that might make you trip as you walk through a door, such as a raised step or threshold. Trim any bushes or trees on the path to your home. Use bright outdoor lighting. Clear any walking paths of anything that might make someone trip, such as rocks or tools. Regularly check to see if handrails are loose or broken. Make sure that both sides of any steps have handrails. Any raised decks and porches should have guardrails on the edges. Have any leaves, snow, or ice cleared regularly. Use sand or salt on walking paths during winter. Clean up any spills in your garage right away. This includes oil or grease spills. What can I do in the bathroom? Use night  lights. Install grab bars by the toilet and in the tub and shower. Do not use towel bars as grab bars. Use non-skid mats or decals in the tub or shower. If you need to sit down in the shower, use a plastic, non-slip stool. Keep the floor dry. Clean up any water that spills on the floor as soon as it happens. Remove soap buildup in the tub or shower regularly. Attach bath mats securely with double-sided non-slip rug tape. Do not have throw rugs and other things on the floor that can make you trip. What can I do in the bedroom? Use night lights. Make sure that you have a light by your bed that is easy to reach. Do not use any sheets or blankets that are too big for your bed. They should not hang down onto the floor. Have a firm chair that has side arms. You can use this for support while you get dressed. Do not have throw rugs and other things on the floor that can make you trip. What can I do in the kitchen? Clean up any spills right away. Avoid walking on wet floors. Keep items that you use  a lot in easy-to-reach places. If you need to reach something above you, use a strong step stool that has a grab bar. Keep electrical cords out of the way. Do not use floor polish or wax that makes floors slippery. If you must use wax, use non-skid floor wax. Do not have throw rugs and other things on the floor that can make you trip. What can I do with my stairs? Do not leave any items on the stairs. Make sure that there are handrails on both sides of the stairs and use them. Fix handrails that are broken or loose. Make sure that handrails are as [place] as the stairways. Check any carpeting to make sure that it is firmly attached to the stairs. Fix any carpet that is loose or worn. Avoid having throw rugs at the top or bottom of the stairs. If you do have throw rugs, attach them to the floor with carpet tape. Make sure that you have a light switch at the top of the stairs and the bottom of the stairs. If  you do not have them, ask someone to add them for you. What else can I do to help prevent falls? Wear shoes that: Do not have high heels. Have rubber bottoms. Are comfortable and fit you well. Are closed at the toe. Do not wear sandals. If you use a stepladder: Make sure that it is fully opened. Do not climb a closed stepladder. Make sure that both sides of the stepladder are locked into place. Ask someone to hold it for you, if possible. Clearly mark and make sure that you can see: Any grab bars or handrails. First and last steps. Where the edge of each step is. Use tools that help you move around (mobility aids) if they are needed. These include: Canes. Walkers. Scooters. Crutches. Turn on the lights when you go into a dark area. Replace any light bulbs as soon as they burn out. Set up your furniture so you have a clear path. Avoid moving your furniture around. If any of your floors are uneven, fix them. If there are any pets around you, be aware of where they are. Review your medicines with your doctor. Some medicines can make you feel dizzy. This can increase your chance of falling. Ask your doctor what other things that you can do to help prevent falls. This information is not intended to replace advice given to you by your health care provider. Make sure you discuss any questions you have with your health care provider. Document Released: 05/03/2009 Document Revised: 12/13/2015 Document Reviewed: 08/11/2014 Elsevier Interactive Patient Education  2017 [employer].

## 2022-06-25 NOTE — Progress Notes (Addendum)
I connected with  Jaymes Graff Auten on 06/25/22 by a audio enabled telemedicine application and verified that I am speaking with the correct person using two identifiers.  Patient Location: Home  Provider Location: Office/Clinic  I discussed the limitations of evaluation and management by telemedicine. The patient expressed understanding and agreed to proceed.   Subjective:   Nicholas Marks is a 66 y.o. male who presents for an Initial Medicare Annual Wellness Visit.  Review of Systems     Cardiac Risk Factors include: advanced age (>54mn, >>75women);diabetes mellitus;dyslipidemia;male gender;obesity (BMI >30kg/m2)     Objective:    Today's Vitals   06/25/22 0823  Weight: 247 lb (112 kg)   Body mass index is 37.56 kg/m.     06/25/2022    8:29 AM 06/21/2019    4:03 PM 08/17/2015    7:56 PM  Advanced Directives  Does Patient Have a Medical Advance Directive? Yes No No  Type of [place]    Copy of [place] Chart? No - copy requested    Would patient like information on creating a medical advance directive?  No - Patient declined No - patient declined information    Current Medications (verified) Outpatient Encounter Medications as of 06/25/2022  Medication Sig   atorvastatin (LIPITOR) 10 MG tablet Take 1 tablet (10 mg total) by mouth daily.   fluticasone (FLONASE) 50 MCG/ACT nasal spray SHAKE LIQUID AND USE 2 SPRAYS IN EACH NOSTRIL DAILY   levocetirizine (XYZAL) 5 MG tablet Take 1 tablet (5 mg total) by mouth every evening.   losartan (COZAAR) 25 MG tablet Take 1 tablet (25 mg total) by mouth daily.   metFORMIN (GLUCOPHAGE) 500 MG tablet Take 1 tablet (500 mg total) by mouth 2 (two) times daily.   montelukast (SINGULAIR) 10 MG tablet Take 1 tablet (10 mg total) by mouth at bedtime.   triamcinolone cream (KENALOG) 0.1 % Apply 1 application. topically 2 (two) times daily.   blood glucose meter kit and supplies  KIT Dispense based on patient and insurance preference. Use up to four times daily as directed. (FOR ICD-9 250.00, 250.01). (Patient not taking: Reported on 06/25/2022)   Blood Glucose Monitoring Suppl (ONETOUCH VERIO) w/Device KIT Use as directed to check sugar 3 times a day.  Dx Code: E11.9 (Patient not taking: Reported on 06/25/2022)   glucose blood (ONETOUCH VERIO) test strip Use as instructed (Patient not taking: Reported on 116/07/958   OneTouch Delica Lancets 345WMISC Use as directed 3 times a day.  Dx Code E11.9 (Patient not taking: Reported on 06/25/2022)   sodium polystyrene (KAYEXALATE) 15 GM/60ML suspension 60 ml po x 4 days (Patient not taking: Reported on 06/25/2022)   No facility-administered encounter medications on file as of 06/25/2022.    Allergies (verified) Patient has no known allergies.   History: Past Medical History:  Diagnosis Date   Diverticulosis 06/09/2022   Kidney stone    Routine health maintenance 12/04/2011   Past Surgical History:  Procedure Laterality Date   APPENDECTOMY     Family History  Problem Relation Age of Onset   Hypertension Neg Hx    Diabetes Neg Hx    Heart disease Neg Hx    Cancer Neg Hx    Social History   Socioeconomic History   Marital status: Widowed    Spouse name: Not on file   Number of children: Not on file   Years of education: Not on file  Highest education level: Not on file  Occupational History   Not on file  Tobacco Use   Smoking status: Former    Packs/day: 1.50    Years: 25.00    Total pack years: 37.50    Types: Cigarettes    Quit date: 06/21/2004    Years since quitting: 18.0   Smokeless tobacco: Never  Vaping Use   Vaping Use: Former  Substance and Sexual Activity   Alcohol use: Yes    Alcohol/week: 2.0 standard drinks of alcohol    Types: 2 Shots of liquor per week    Comment: socially might go months   Drug use: Never   Sexual activity: Not on file  Other Topics Concern   Not on file  Social  History Narrative   Not on file   Social Determinants of Health   Financial Resource Strain: Low Risk  (06/25/2022)   Overall Financial Resource Strain (CARDIA)    Difficulty of Paying Living Expenses: Not hard at all  Food Insecurity: No Food Insecurity (06/25/2022)   Hunger Vital Sign    Worried About Running Out of Food in the Last Year: Never true    [place] in the Last Year: Never true  Transportation Needs: No Transportation Needs (06/25/2022)   PRAPARE - Hydrologist (Medical): No    Lack of Transportation (Non-Medical): No  Physical Activity: Sufficiently Active (06/25/2022)   Exercise Vital Sign    Days of Exercise per Week: 5 days    Minutes of Exercise per Session: 30 min  Stress: No Stress Concern Present (06/25/2022)   [place]    Feeling of Stress : Not at all  Social Connections: Moderately Isolated (06/25/2022)   Social Connection and Isolation Panel [NHANES]    Frequency of Communication with Friends and Family: Twice a week    Frequency of Social Gatherings with Friends and Family: More than three times a week    Attends Religious Services: More than 4 times per year    Active Member of [employer] or Organizations: No    Attends Archivist Meetings: Never    Marital Status: Widowed    Tobacco Counseling Counseling given: Not Answered   Clinical Intake:  Pre-visit preparation completed: Yes  Pain : No/denies pain     BMI - recorded: 37.56 Nutritional Status: BMI > 30  Obese Nutritional Risks: None Diabetes: Yes CBG done?: No Did pt. bring in CBG monitor from home?: No  How often do you need to have someone help you when you read instructions, pamphlets, or other written materials from your doctor or pharmacy?: 1 - Never  Diabetic?Nutrition Risk Assessment:  Has the patient had any N/V/D within the last 2 months?  No  Does the patient have any  non-healing wounds?  No  Has the patient had any unintentional weight loss or weight gain?  No   Diabetes:  Is the patient diabetic?  Yes  If diabetic, was a CBG obtained today?  No  Did the patient bring in their glucometer from home?  No  How often do you monitor your CBG's? N/A.   Financial Strains and Diabetes Management:  Are you having any financial strains with the device, your supplies or your medication? No .  Does the patient want to be seen by Chronic Care Management for management of their diabetes?  No  Would the patient like to be referred to a  Nutritionist or for Diabetic Management?  No   Diabetic Exams:  Diabetic Eye Exam: Overdue for diabetic eye exam. Pt has been advised about the importance in completing this exam. Patient advised to call and schedule an eye exam. Diabetic Foot Exam: Overdue, Pt has been advised about the importance in completing this exam. Pt is scheduled for diabetic foot exam on next appt .   Interpreter Needed?: No  Information entered by :: Charlott Rakes, LPN   Activities of Daily Living    06/25/2022    8:30 AM  In your present state of health, do you have any difficulty performing the following activities:  Hearing? 0  Vision? 0  Difficulty concentrating or making decisions? 0  Walking or climbing stairs? 0  Dressing or bathing? 0  Doing errands, shopping? 0  Preparing Food and eating ? N  Using the Toilet? N  In the past six months, have you accidently leaked urine? N  Do you have problems with loss of bowel control? N  Managing your Medications? N  Managing your Finances? N  Housekeeping or managing your Housekeeping? N    Patient Care Team: Saguier, Iris Pert as PCP - General (Internal Medicine)  Indicate any recent Medical Services you may have received from other than [facility] providers in the past year (date may be approximate).     Assessment:   This is a routine wellness examination for Vearl.  Hearing/Vision  screen Hearing Screening - Comments:: Pt denies any hearing issues  Vision Screening - Comments:: Pt follows up with provider on [address] for annul eye exams   Dietary issues and exercise activities discussed: Current Exercise Habits: Home exercise routine, Type of exercise: treadmill;Other - see comments, Time (Minutes): 30, Frequency (Times/Week): 5, Weekly Exercise (Minutes/Week): 150   Goals Addressed             This Visit's Progress    Patient Stated       Lose weight       Depression Screen    06/25/2022    8:27 AM 11/29/2021    8:00 AM 11/26/2020    8:01 AM 08/27/2020   10:44 AM  PHQ 2/9 Scores  PHQ - 2 Score 0 0 0 0    Fall Risk    06/25/2022    8:30 AM 11/29/2021    8:00 AM  [place] in the past year? 0 0  Number falls in past yr: 0 0  Injury with Fall? 0 0  Risk for fall due to : Impaired vision No Fall Risks  Follow up Falls prevention discussed Falls evaluation completed    FALL RISK PREVENTION PERTAINING TO THE HOME:  Any stairs in or around the home? Yes  If so, are there any without handrails? No  Home free of loose throw rugs in walkways, pet beds, electrical cords, etc? Yes  Adequate lighting in your home to reduce risk of falls? Yes   ASSISTIVE DEVICES UTILIZED TO PREVENT FALLS:  Life alert? Yes Use of a cane, walker or w/c? No  Grab bars in the bathroom? Yes  Shower chair or bench in shower? No  Elevated toilet seat or a handicapped toilet? No   TIMED UP AND GO:  Was the test performed? No .  Cognitive Function:        06/25/2022    8:32 AM  6CIT Screen  What Year? 0 points  What month? 0 points  What time? 0 points  Count back from  20 0 points  Months in reverse 0 points  Repeat phrase 0 points  Total Score 0 points    Immunizations Immunization History  Administered Date(s) Administered   Fluad Quad(high Dose 65+) 06/06/2022   Influenza, High Dose Seasonal PF 05/19/2021   Influenza,inj,Quad PF,6+ Mos 05/10/2019    Influenza-Unspecified 04/30/2020, 05/20/2021   Janssen (J&J) SARS-COV-2 Vaccination 11/12/2019, 06/11/2020   PNEUMOCOCCAL CONJUGATE-20 05/19/2021   Pneumococcal Conjugate-13 05/10/2019, 04/30/2020, 05/20/2021   Zoster Recombinat (Shingrix) 07/07/2019, 06/06/2022    TDAP status: Due, Education has been provided regarding the importance of this vaccine. Advised may receive this vaccine at local pharmacy or Health Dept. Aware to provide a copy of the vaccination record if obtained from local pharmacy or Health Dept. Verbalized acceptance and understanding.  Flu Vaccine status: Up to date  Pneumococcal vaccine status: Up to date  Covid-19 vaccine status: Completed vaccines  Qualifies for Shingles Vaccine? Yes   Zostavax completed Yes   Shingrix Completed?: Yes  Screening Tests Health Maintenance  Topic Date Due   FOOT EXAM  Never done   OPHTHALMOLOGY EXAM  Never done   Diabetic kidney evaluation - Urine ACR  Never done   Hepatitis C Screening  Never done   DTaP/Tdap/Td (1 - Tdap) Never done   Lung Cancer Screening  08/10/2020   COVID-19 Vaccine (3 - 2023-24 season) 03/21/2022   HEMOGLOBIN A1C  12/05/2022   Diabetic kidney evaluation - GFR measurement  06/07/2023   Medicare Annual Wellness (AWV)  06/26/2023   Fecal DNA (Cologuard)  09/20/2023   Pneumonia Vaccine 26+ Years old  Completed   INFLUENZA VACCINE  Completed   Zoster Vaccines- Shingrix  Completed   HPV VACCINES  Aged Out    Health Maintenance  Health Maintenance Due  Topic Date Due   FOOT EXAM  Never done   OPHTHALMOLOGY EXAM  Never done   Diabetic kidney evaluation - Urine ACR  Never done   Hepatitis C Screening  Never done   DTaP/Tdap/Td (1 - Tdap) Never done   Lung Cancer Screening  08/10/2020   COVID-19 Vaccine (3 - 2023-24 season) 03/21/2022    Colorectal cancer screening: Type of screening: Cologuard. Completed 09/19/20. Repeat every 3 years  Additional Screening:  Hepatitis C Screening: does  qualify;  Vision Screening: Recommended annual ophthalmology exams for early detection of glaucoma and other disorders of the eye. Is the patient up to date with their annual eye exam?  Yes  Who is the provider or what is the name of the office in which the patient attends annual eye exams? Provider on [address]  If pt is not established with a provider, would they like to be referred to a provider to establish care? No .   Dental Screening: Recommended annual dental exams for proper oral hygiene  Community Resource Referral / Chronic Care Management: CRR required this visit?  No   CCM required this visit?  No      Plan:     I have personally reviewed and noted the following in the patient's chart:   Medical and social history Use of alcohol, tobacco or illicit drugs  Current medications and supplements including opioid prescriptions. Patient is not currently taking opioid prescriptions. Functional ability and status Nutritional status Physical activity Advanced directives List of other physicians Hospitalizations, surgeries, and ER visits in previous 12 months Vitals Screenings to include cognitive, depression, and falls Referrals and appointments  In addition, I have reviewed and discussed with patient certain preventive protocols, quality metrics,  and best practice recommendations. A written personalized care plan for preventive services as well as general preventive health recommendations were provided to patient.     Willette Brace, LPN   83/01/2901   Nurse Notes: none    Review and Agree with assessment & plan of LPN   Mackie Pai, PA-C

## 2022-06-27 ENCOUNTER — Ambulatory Visit (HOSPITAL_COMMUNITY): Payer: Medicare Other | Attending: Cardiology

## 2022-06-27 DIAGNOSIS — R011 Cardiac murmur, unspecified: Secondary | ICD-10-CM | POA: Diagnosis present

## 2022-06-27 LAB — ECHOCARDIOGRAM COMPLETE
Area-P 1/2: 3.98 cm2
S' Lateral: 3 cm

## 2022-06-30 ENCOUNTER — Telehealth (HOSPITAL_COMMUNITY): Payer: Self-pay | Admitting: *Deleted

## 2022-06-30 NOTE — Telephone Encounter (Signed)
Patient given detailed instructions per Stress Test Requisition Sheet for test on 07/02/22 at 1500.Patient Notified to arrive 30 minutes early, and that it is imperative to arrive on time for appointment to keep from having the test rescheduled.  Patient verbalized understanding. Nicholas Marks, Adelene Idler

## 2022-07-02 ENCOUNTER — Ambulatory Visit (HOSPITAL_COMMUNITY): Payer: Medicare Other | Attending: Cardiology

## 2022-07-02 ENCOUNTER — Ambulatory Visit (HOSPITAL_BASED_OUTPATIENT_CLINIC_OR_DEPARTMENT_OTHER): Payer: Medicare Other

## 2022-07-02 DIAGNOSIS — R931 Abnormal findings on diagnostic imaging of heart and coronary circulation: Secondary | ICD-10-CM | POA: Diagnosis present

## 2022-07-02 DIAGNOSIS — I10 Essential (primary) hypertension: Secondary | ICD-10-CM | POA: Diagnosis not present

## 2022-07-02 DIAGNOSIS — I2510 Atherosclerotic heart disease of native coronary artery without angina pectoris: Secondary | ICD-10-CM | POA: Insufficient documentation

## 2022-07-02 MED ORDER — PERFLUTREN LIPID MICROSPHERE
1.0000 mL | INTRAVENOUS | Status: AC | PRN
  Administered 2022-07-02: 3 mL via INTRAVENOUS

## 2022-09-01 ENCOUNTER — Other Ambulatory Visit: Payer: Self-pay | Admitting: Medical

## 2022-11-30 ENCOUNTER — Other Ambulatory Visit: Payer: Self-pay | Admitting: Medical

## 2022-12-05 ENCOUNTER — Ambulatory Visit (INDEPENDENT_AMBULATORY_CARE_PROVIDER_SITE_OTHER): Payer: Medicare Other | Admitting: Medical

## 2022-12-05 VITALS — BP 132/74 | HR 77 | Temp 98.0°F | Resp 18 | Ht 68.0 in | Wt 258.0 lb

## 2022-12-05 DIAGNOSIS — E785 Hyperlipidemia, unspecified: Secondary | ICD-10-CM

## 2022-12-05 DIAGNOSIS — E119 Type 2 diabetes mellitus without complications: Secondary | ICD-10-CM

## 2022-12-05 DIAGNOSIS — Z7984 Long term (current) use of oral hypoglycemic drugs: Secondary | ICD-10-CM | POA: Diagnosis not present

## 2022-12-05 DIAGNOSIS — Z87891 Personal history of nicotine dependence: Secondary | ICD-10-CM | POA: Diagnosis not present

## 2022-12-05 DIAGNOSIS — I10 Essential (primary) hypertension: Secondary | ICD-10-CM

## 2022-12-05 LAB — LIPID PANEL
Cholesterol: 124 mg/dL (ref 0–200)
HDL: 37.2 mg/dL — ABNORMAL LOW (ref >39.00)
LDL Cholesterol: 67 mg/dL (ref 0–99)
NonHDL: 87.14
Total CHOL/HDL Ratio: 3
Triglycerides: 100 mg/dL (ref 0.0–149.0)
VLDL: 20 mg/dL (ref 0.0–40.0)

## 2022-12-05 LAB — COMPREHENSIVE METABOLIC PANEL
ALT: 32 U/L (ref 0–53)
AST: 24 U/L (ref 0–37)
Albumin: 4.6 g/dL (ref 3.5–5.2)
Alkaline Phosphatase: 58 U/L (ref 39–117)
BUN: 14 mg/dL (ref 6–23)
CO2: 23 mEq/L (ref 19–32)
Calcium: 9.3 mg/dL (ref 8.4–10.5)
Chloride: 106 mEq/L (ref 96–112)
Creatinine, Ser: 0.94 mg/dL (ref 0.40–1.50)
GFR: 83.9 mL/min (ref >60.00)
Glucose, Bld: 98 mg/dL (ref 70–99)
Potassium: 4.1 mEq/L (ref 3.5–5.1)
Sodium: 139 mEq/L (ref 135–145)
Total Bilirubin: 1 mg/dL (ref 0.2–1.2)
Total Protein: 7.1 g/dL (ref 6.0–8.3)

## 2022-12-05 LAB — MICROALBUMIN / CREATININE URINE RATIO
Creatinine,U: 110.8 mg/dL
Microalb Creat Ratio: 0.9 mg/g (ref 0.0–30.0)
Microalb, Ur: 1 mg/dL (ref 0.0–1.9)

## 2022-12-05 LAB — HEMOGLOBIN A1C: Hgb A1c MFr Bld: 6.1 % (ref 4.6–6.5)

## 2022-12-05 NOTE — Progress Notes (Signed)
Subjective:    Patient ID: Nicholas Marks, male    DOB: 08-Sep-1955, 67 y.o.   MRN: [id number]  HPI  Pt here for follow up. Last AVS.  "Htn well controlled. Continue losartan 25 mg daily.   Diabetes. Check a1c. Continue metformin and may adjust treatment after lab. Check cmp and a1c.   High cholesterol. Continue lipitor and check lipid panel today. Counseled on 10 year cardiovascular score and reasoning behind using statin.   For allergies not ideally controlled use flonase and xyzal. But will add montelukast antihistamine.   Placed referral to pulmonlologist to see if you need screening ct chest.   Also placed referral to cardiologist to discuss past ct chest findings and degree of treatment needed."  Pt did see cardiologist and had echo, ct coronary and echo stress."  Htn controlled today.  High cholesterol- he is on atrovastatin.  Allergies- he is only using netty pot.  Hx of smoking 25-30 years pack to 1.5 pack a day.   Review of Systems  Constitutional:  Negative for chills, fatigue and fever.  Respiratory:  Negative for cough, chest tightness, shortness of breath and wheezing.   Cardiovascular:  Negative for chest pain and palpitations.  Gastrointestinal:  Negative for abdominal pain and blood in stool.  Genitourinary:  Negative for dysuria, flank pain, frequency and genital sores.  Musculoskeletal:  Negative for back pain and joint swelling.  Skin:  Negative for rash.  Neurological:  Negative for dizziness, seizures, weakness, numbness and headaches.  Hematological:  Negative for adenopathy. Does not bruise/bleed easily.  Psychiatric/Behavioral:  Negative for behavioral problems, decreased concentration, hallucinations and sleep disturbance. The patient is not nervous/anxious.     Past Medical History:  Diagnosis Date   Diverticulosis 06/09/2022   Kidney stone    Routine health maintenance 12/04/2011     Social History   Socioeconomic History   Marital  status: Widowed    Spouse name: Not on file   Number of children: Not on file   Years of education: Not on file   Highest education level: Bachelor's degree (e.g., BA, AB, BS)  Occupational History   Not on file  Tobacco Use   Smoking status: Former    Packs/day: 1.50    Years: 25.00    Additional pack years: 0.00    Total pack years: 37.50    Types: Cigarettes    Quit date: 06/21/2004    Years since quitting: 18.4   Smokeless tobacco: Never  Vaping Use   Vaping Use: Former  Substance and Sexual Activity   Alcohol use: Yes    Alcohol/week: 2.0 standard drinks of alcohol    Types: 2 Shots of liquor per week    Comment: socially might go months   Drug use: Never   Sexual activity: Not on file  Other Topics Concern   Not on file  Social History Narrative   Not on file   Social Determinants of Health   Financial Resource Strain: Low Risk  (06/25/2022)   Overall Financial Resource Strain (CARDIA)    Difficulty of Paying Living Expenses: Not hard at all  Food Insecurity: No Food Insecurity (12/02/2022)   Hunger Vital Sign    Worried About Running Out of Food in the Last Year: Never true    Ran Out of Food in the Last Year: Never true  Transportation Needs: No Transportation Needs (12/02/2022)   PRAPARE - Transportation    Lack of Transportation (Medical): No    Lack of  Transportation (Non-Medical): No  Physical Activity: Unknown (12/02/2022)   Exercise Vital Sign    Days of Exercise per Week: Patient declined    Minutes of Exercise per Session: 30 min  Stress: No Stress Concern Present (12/02/2022)   [employer] of Occupational Health - Occupational Stress Questionnaire    Feeling of Stress : Not at all  Social Connections: Moderately Integrated (12/02/2022)   Social Connection and Isolation Panel [NHANES]    Frequency of Communication with Friends and Family: Once a week    Frequency of Social Gatherings with Friends and Family: Twice a week    Attends Religious  Services: More than 4 times per year    Active Member of [employer] or Organizations: Yes    Attends Banker Meetings: More than 4 times per year    Marital Status: Widowed  Intimate Partner Violence: Not At Risk (06/25/2022)   Humiliation, Afraid, Rape, and Kick questionnaire    Fear of Current or Ex-Partner: No    Emotionally Abused: No    Physically Abused: No    Sexually Abused: No    Past Surgical History:  Procedure Laterality Date   APPENDECTOMY      Family History  Problem Relation Age of Onset   Hypertension Neg Hx    Diabetes Neg Hx    Heart disease Neg Hx    Cancer Neg Hx     No Known Allergies  Current Outpatient Medications on File Prior to Visit  Medication Sig Dispense Refill   atorvastatin (LIPITOR) 10 MG tablet TAKE 1 TABLET(10 MG) BY MOUTH DAILY 90 tablet 1   blood glucose meter kit and supplies KIT Dispense based on patient and insurance preference. Use up to four times daily as directed. (FOR ICD-9 250.00, 250.01). (Patient not taking: Reported on 06/25/2022) 1 each 0   Blood Glucose Monitoring Suppl (ONETOUCH VERIO) w/Device KIT Use as directed to check sugar 3 times a day.  Dx Code: E11.9 (Patient not taking: Reported on 06/25/2022) 1 kit 0   fluticasone (FLONASE) 50 MCG/ACT nasal spray SHAKE LIQUID AND USE 2 SPRAYS IN EACH NOSTRIL DAILY 48 g 2   glucose blood (ONETOUCH VERIO) test strip Use as instructed (Patient not taking: Reported on 06/25/2022) 300 each 1   levocetirizine (XYZAL) 5 MG tablet TAKE 1 TABLET(5 MG) BY MOUTH EVERY EVENING 90 tablet 0   losartan (COZAAR) 25 MG tablet TAKE 1 TABLET(25 MG) BY MOUTH DAILY 90 tablet 1   metFORMIN (GLUCOPHAGE) 500 MG tablet TAKE 1 TABLET(500 MG) BY MOUTH TWICE DAILY 180 tablet 1   montelukast (SINGULAIR) 10 MG tablet Take 1 tablet (10 mg total) by mouth at bedtime. 30 tablet 11   OneTouch Delica Lancets 33G MISC Use as directed 3 times a day.  Dx Code E11.9 (Patient not taking: Reported on 06/25/2022) 300 each  1   triamcinolone cream (KENALOG) 0.1 % Apply 1 application. topically 2 (two) times daily. 30 g 2   No current facility-administered medications on file prior to visit.    BP 132/74   Pulse 77   Temp 98 F (36.7 C)   Resp 18   Ht 5\' 8"  (1.727 m)   Wt 258 lb (117 kg)   SpO2 93%   BMI 39.23 kg/m        Objective:   Physical Exam  General Mental Status- Alert. General Appearance- Not in acute distress.   Skin General: Color- Normal Color. Moisture- Normal Moisture.  Neck Carotid Arteries- Normal color. Moisture-  Normal Moisture. No carotid bruits. No JVD.  Chest and Lung Exam Auscultation: Breath Sounds:-Normal.  Cardiovascular Auscultation:Rythm- Regular. Murmurs & Other Heart Sounds:Auscultation of the heart reveals- No Murmurs.  Abdomen Inspection:-Inspeection Normal. Palpation/Percussion:Note:No mass. Palpation and Percussion of the abdomen reveal- Non Tender, Non Distended + BS, no rebound or guarding.   Neurologic Cranial Nerve exam:- CN III-XII intact(No nystagmus), symmetric smile. Strength:- 5/5 equal and symmetric strength both upper and lower extremities.   Lower ext/feet- see quality metrics.     Assessment & Plan:   Patient Instructions  1. Diabetes mellitus without complication (HCC) Continue metformin - Urine Microalbumin w/creat. ratio - Hemoglobin A1c  2. Hypertension, unspecified type. Bp well controlled Continue losartan 25 mg daily. - Comp Met (CMET)  3. Hyperlipidemia, unspecified hyperlipidemia type On atorvastatin. - Lipid panel  4. History of smoking Do think best to follow up with specialist to get opinion on ct scan based on smoking hx. - Ambulatory referral to Pulmonology   Follow up date to be determined based on lab results. 3-6 months.    Esperanza Richters, PA-C

## 2022-12-05 NOTE — Patient Instructions (Addendum)
1. Diabetes mellitus without complication (HCC) Continue metformin - Urine Microalbumin w/creat. ratio - Hemoglobin A1c  2. Hypertension, unspecified type. Bp well controlled Continue losartan 25 mg daily. - Comp Met (CMET)  3. Hyperlipidemia, unspecified hyperlipidemia type On atorvastatin. - Lipid panel  4. History of smoking Do think best to follow up with specialist to get opinion on ct scan based on smoking hx. - Ambulatory referral to Pulmonology   Discussed wt loss. Ozempic as option. Rx advisement explained.  Follow up date to be determined based on lab results. 3-6 months.

## 2022-12-10 ENCOUNTER — Other Ambulatory Visit: Payer: Self-pay | Admitting: Medical

## 2023-02-28 ENCOUNTER — Other Ambulatory Visit: Payer: Self-pay | Admitting: Medical

## 2023-04-06 ENCOUNTER — Telehealth: Payer: Self-pay | Admitting: *Deleted

## 2023-04-06 NOTE — Telephone Encounter (Signed)
Entered in error

## 2023-05-29 ENCOUNTER — Other Ambulatory Visit: Payer: Self-pay | Admitting: Medical

## 2023-06-04 ENCOUNTER — Encounter: Payer: Self-pay | Admitting: Medical

## 2023-06-04 ENCOUNTER — Ambulatory Visit: Payer: Medicare Other | Admitting: Medical

## 2023-06-04 VITALS — BP 137/78 | HR 74 | Temp 98.0°F | Resp 18 | Ht 68.0 in | Wt 256.0 lb

## 2023-06-04 DIAGNOSIS — E669 Obesity, unspecified: Secondary | ICD-10-CM

## 2023-06-04 DIAGNOSIS — Z7984 Long term (current) use of oral hypoglycemic drugs: Secondary | ICD-10-CM

## 2023-06-04 DIAGNOSIS — Z23 Encounter for immunization: Secondary | ICD-10-CM

## 2023-06-04 DIAGNOSIS — E119 Type 2 diabetes mellitus without complications: Secondary | ICD-10-CM | POA: Diagnosis not present

## 2023-06-04 DIAGNOSIS — E785 Hyperlipidemia, unspecified: Secondary | ICD-10-CM | POA: Diagnosis not present

## 2023-06-04 DIAGNOSIS — I10 Essential (primary) hypertension: Secondary | ICD-10-CM

## 2023-06-04 LAB — LIPID PANEL
Cholesterol: 124 mg/dL (ref 0–200)
HDL: 34.1 mg/dL — ABNORMAL LOW (ref >39.00)
LDL Cholesterol: 70 mg/dL (ref 0–99)
NonHDL: 89.96
Total CHOL/HDL Ratio: 4
Triglycerides: 100 mg/dL (ref 0.0–149.0)
VLDL: 20 mg/dL (ref 0.0–40.0)

## 2023-06-04 LAB — COMPREHENSIVE METABOLIC PANEL
ALT: 28 U/L (ref 0–53)
AST: 21 U/L (ref 0–37)
Albumin: 4.6 g/dL (ref 3.5–5.2)
Alkaline Phosphatase: 61 U/L (ref 39–117)
BUN: 16 mg/dL (ref 6–23)
CO2: 25 meq/L (ref 19–32)
Calcium: 9.2 mg/dL (ref 8.4–10.5)
Chloride: 105 meq/L (ref 96–112)
Creatinine, Ser: 0.94 mg/dL (ref 0.40–1.50)
GFR: 83.61 mL/min (ref >60.00)
Glucose, Bld: 96 mg/dL (ref 70–99)
Potassium: 4.2 meq/L (ref 3.5–5.1)
Sodium: 139 meq/L (ref 135–145)
Total Bilirubin: 1.1 mg/dL (ref 0.2–1.2)
Total Protein: 7.1 g/dL (ref 6.0–8.3)

## 2023-06-04 LAB — HEMOGLOBIN A1C: Hgb A1c MFr Bld: 6.1 % (ref 4.6–6.5)

## 2023-06-04 NOTE — Progress Notes (Signed)
Subjective:    Patient ID: Nicholas Marks, male    DOB: 1956-02-22, 67 y.o.   MRN: [id number]  HPI  Pt in for follow up.  "1. Diabetes mellitus without complication (HCC) Continue metformin - Urine Microalbumin w/creat. ratio - Hemoglobin A1c   2. Hypertension, unspecified type. Bp well controlled Continue losartan 25 mg daily. - Comp Met (CMET)   3. Hyperlipidemia, unspecified hyperlipidemia type On atorvastatin. - Lipid panel   4. History of smoking Do think best to follow up with specialist to get opinion on ct scan based on smoking hx. - Ambulatory referral to Pulmonology"   Last A1c was 6.1. pt is on metformin and low sugar diet.  Htn- pt is on losartan 25 mg daily. Recheck bp better.  High cholesterol. On atorvastatin. He is fasting today.  Pt states he is not interested in getting further lung ct. Hx of smoking but quit. No shortness of breath. No wheezing. Pt declines referral to pulmonlogist.   Obesity- pt wants to loose weight. He wants to consider ozempic. Pt denies any history of pancreatitis. No family history of thyroid medulllary cancer in family. Benefits vs risk discussed he wants to hold off on efforts.     Review of Systems  Constitutional:  Negative for chills, fatigue and fever.  HENT:  Negative for congestion and drooling.   Respiratory:  Negative for chest tightness, shortness of breath and wheezing.   Cardiovascular:  Negative for chest pain and palpitations.  Gastrointestinal:  Negative for abdominal pain.  Genitourinary:  Negative for dysuria.  Musculoskeletal:  Negative for back pain and joint swelling.  Skin:  Negative for rash.  Neurological:  Negative for dizziness, weakness and light-headedness.  Hematological:  Negative for adenopathy. Does not bruise/bleed easily.  Psychiatric/Behavioral:  Negative for behavioral problems and decreased concentration.    Past Medical History:  Diagnosis Date   Diverticulosis 06/09/2022   Kidney  stone    Routine health maintenance 12/04/2011     Social History   Socioeconomic History   Marital status: Widowed    Spouse name: Not on file   Number of children: Not on file   Years of education: Not on file   Highest education level: Bachelor's degree (e.g., BA, AB, BS)  Occupational History   Not on file  Tobacco Use   Smoking status: Former    Current packs/day: 0.00    Average packs/day: 1.5 packs/day for 25.0 years (37.5 ttl pk-yrs)    Types: Cigarettes    Start date: 06/22/1979    Quit date: 06/21/2004    Years since quitting: 18.9   Smokeless tobacco: Never  Vaping Use   Vaping status: Former  Substance and Sexual Activity   Alcohol use: Yes    Alcohol/week: 2.0 standard drinks of alcohol    Types: 2 Shots of liquor per week    Comment: socially might go months   Drug use: Never   Sexual activity: Not on file  Other Topics Concern   Not on file  Social History Narrative   Not on file   Social Determinants of Health   Financial Resource Strain: Patient Declined (06/03/2023)   Overall Financial Resource Strain (CARDIA)    Difficulty of Paying Living Expenses: Patient declined  Food Insecurity: Patient Declined (06/03/2023)   Hunger Vital Sign    Worried About Running Out of Food in the Last Year: Patient declined    Ran Out of Food in the Last Year: Patient declined  Transportation Needs:  Patient Declined (06/03/2023)   PRAPARE - Administrator, Civil Service (Medical): Patient declined    Lack of Transportation (Non-Medical): Patient declined  Physical Activity: Insufficiently Active (06/03/2023)   Exercise Vital Sign    Days of Exercise per Week: 4 days    Minutes of Exercise per Session: 30 min  Stress: No Stress Concern Present (06/03/2023)   [employer] of Occupational Health - Occupational Stress Questionnaire    Feeling of Stress : Not at all  Social Connections: Unknown (06/03/2023)   Social Connection and Isolation Panel  [NHANES]    Frequency of Communication with Friends and Family: Patient declined    Frequency of Social Gatherings with Friends and Family: Patient declined    Attends Religious Services: Patient declined    Database administrator or Organizations: Patient declined    Attends Engineer, structural: More than 4 times per year    Marital Status: Widowed  Intimate Partner Violence: Not At Risk (06/25/2022)   Humiliation, Afraid, Rape, and Kick questionnaire    Fear of Current or Ex-Partner: No    Emotionally Abused: No    Physically Abused: No    Sexually Abused: No    Past Surgical History:  Procedure Laterality Date   APPENDECTOMY      Family History  Problem Relation Age of Onset   Hypertension Neg Hx    Diabetes Neg Hx    Heart disease Neg Hx    Cancer Neg Hx     No Known Allergies  Current Outpatient Medications on File Prior to Visit  Medication Sig Dispense Refill   atorvastatin (LIPITOR) 10 MG tablet TAKE 1 TABLET(10 MG) BY MOUTH DAILY 90 tablet 1   blood glucose meter kit and supplies KIT Dispense based on patient and insurance preference. Use up to four times daily as directed. (FOR ICD-9 250.00, 250.01). (Patient not taking: Reported on 06/25/2022) 1 each 0   Blood Glucose Monitoring Suppl (ONETOUCH VERIO) w/Device KIT Use as directed to check sugar 3 times a day.  Dx Code: E11.9 (Patient not taking: Reported on 06/25/2022) 1 kit 0   glucose blood (ONETOUCH VERIO) test strip Use as instructed (Patient not taking: Reported on 06/25/2022) 300 each 1   levocetirizine (XYZAL) 5 MG tablet TAKE 1 TABLET(5 MG) BY MOUTH EVERY EVENING 90 tablet 0   losartan (COZAAR) 25 MG tablet TAKE 1 TABLET(25 MG) BY MOUTH DAILY 90 tablet 1   metFORMIN (GLUCOPHAGE) 500 MG tablet TAKE 1 TABLET(500 MG) BY MOUTH TWICE DAILY 180 tablet 1   OneTouch Delica Lancets 33G MISC Use as directed 3 times a day.  Dx Code E11.9 (Patient not taking: Reported on 06/25/2022) 300 each 1   No current  facility-administered medications on file prior to visit.    BP 137/78   Pulse 74   Temp 98 F (36.7 C)   Resp 18   Ht 5\' 8"  (1.727 m)   Wt 256 lb (116.1 kg)   SpO2 97%   BMI 38.92 kg/m           Objective:   Physical Exam  General Mental Status- Alert. General Appearance- Not in acute distress.   Skin General: Color- Normal Color. Moisture- Normal Moisture.  Neck Carotid Arteries- Normal color. Moisture- Normal Moisture. No carotid bruits. No JVD.  Chest and Lung Exam Auscultation: Breath Sounds:-Normal.  Cardiovascular Auscultation:Rythm- Regular. Murmurs & Other Heart Sounds:Auscultation of the heart reveals- No Murmurs.  Abdomen Inspection:-Inspeection Normal. Palpation/Percussion:Note:No mass. Palpation and  Percussion of the abdomen reveal- Non Tender, Non Distended + BS, no rebound or guarding.   Neurologic Cranial Nerve exam:- CN III-XII intact(No nystagmus), symmetric smile. Strength:- 5/5 equal and symmetric strength both upper and lower extremities.       Assessment & Plan:   Patient Instructions  1. Hypertension, unspecified type On 2nd check bp well controlled. Continue losartan. - Comp Met (CMET) - Lipid panel  2. Hyperlipidemia, unspecified hyperlipidemia type -advise low cholesterol diet and exercise. Continue atorvastatin.  3. Diabetes mellitus without complication (HCC) -continue metformin and low sugar diet. Continue metformin - Hemoglobin A1c  4. Obesity (BMI 35.0-39.9 without comorbidity)  -discussed diet and exercise. Discussed benefit vs risk ozempic. If you decide you want to try let me know.  Follow up date to be determined after lab review.   Esperanza Richters, PA-C

## 2023-06-04 NOTE — Patient Instructions (Signed)
1. Hypertension, unspecified type On 2nd check bp well controlled. Continue losartan. - Comp Met (CMET) - Lipid panel  2. Hyperlipidemia, unspecified hyperlipidemia type -advise low cholesterol diet and exercise. Continue atorvastatin.  3. Diabetes mellitus without complication (HCC) -continue metformin and low sugar diet. Continue metformin - Hemoglobin A1c  4. Obesity (BMI 35.0-39.9 without comorbidity)  -discussed diet and exercise. Discussed benefit vs risk ozempic. If you decide you want to try let me know.  Follow up date to be determined after lab review.

## 2023-06-04 NOTE — Addendum Note (Signed)
Addended by: Maximino Sarin on: 06/04/2023 09:03 AM   Modules accepted: Orders

## 2023-06-25 ENCOUNTER — Telehealth: Payer: Self-pay | Admitting: Medical

## 2023-06-25 NOTE — Telephone Encounter (Signed)
Copied from CRM [id number]. Topic: Medicare AWV >> Jun 25, 2023  2:31 PM Payton Doughty wrote: Reason for CRM: Called LVM 06/25/2023 to schedule Annual Wellness Visit  Verlee Rossetti; Care Guide Ambulatory Clinical Support [place] [facility] Direct Dial: [phone]

## 2023-11-05 MED ORDER — ATORVASTATIN CALCIUM 10 MG PO TABS: 10.0000 mg | ORAL_TABLET | Freq: Every day | ORAL | 3 refills | Status: DC

## 2023-11-05 NOTE — Addendum Note (Signed)
 Addended by: Serafina Damme on: 11/05/2023 08:41 PM   Modules accepted: Orders

## 2023-11-25 ENCOUNTER — Other Ambulatory Visit: Payer: Self-pay | Admitting: Medical

## 2023-12-04 ENCOUNTER — Ambulatory Visit: Payer: Medicare Other | Admitting: Medical

## 2024-05-09 ENCOUNTER — Ambulatory Visit: Admitting: Medical

## 2024-05-09 ENCOUNTER — Ambulatory Visit: Payer: Self-pay | Admitting: Medical

## 2024-05-09 ENCOUNTER — Encounter: Payer: Self-pay | Admitting: Medical

## 2024-05-09 VITALS — BP 127/79 | HR 77 | Temp 98.0°F | Resp 15 | Ht 68.0 in | Wt 252.2 lb

## 2024-05-09 DIAGNOSIS — E119 Type 2 diabetes mellitus without complications: Secondary | ICD-10-CM | POA: Diagnosis not present

## 2024-05-09 DIAGNOSIS — R972 Elevated prostate specific antigen [PSA]: Secondary | ICD-10-CM

## 2024-05-09 DIAGNOSIS — I10 Essential (primary) hypertension: Secondary | ICD-10-CM | POA: Diagnosis not present

## 2024-05-09 DIAGNOSIS — E669 Obesity, unspecified: Secondary | ICD-10-CM | POA: Diagnosis not present

## 2024-05-09 DIAGNOSIS — R351 Nocturia: Secondary | ICD-10-CM | POA: Diagnosis not present

## 2024-05-09 DIAGNOSIS — E785 Hyperlipidemia, unspecified: Secondary | ICD-10-CM

## 2024-05-09 DIAGNOSIS — Z23 Encounter for immunization: Secondary | ICD-10-CM

## 2024-05-09 DIAGNOSIS — Z7984 Long term (current) use of oral hypoglycemic drugs: Secondary | ICD-10-CM

## 2024-05-09 LAB — COMPLETE METABOLIC PANEL WITHOUT GFR
AG Ratio: 1.9 (calc) (ref 1.0–2.5)
ALT: 24 U/L (ref 9–46)
AST: 20 U/L (ref 10–35)
Albumin: 4.7 g/dL (ref 3.6–5.1)
Alkaline phosphatase (APISO): 52 U/L (ref 35–144)
BUN: 17 mg/dL (ref 7–25)
CO2: 23 mmol/L (ref 20–32)
Calcium: 9 mg/dL (ref 8.6–10.3)
Chloride: 105 mmol/L (ref 98–110)
Creat: 0.97 mg/dL (ref 0.70–1.35)
Globulin: 2.5 g/dL (ref 1.9–3.7)
Glucose, Bld: 95 mg/dL (ref 65–99)
Potassium: 4.3 mmol/L (ref 3.5–5.3)
Sodium: 140 mmol/L (ref 135–146)
Total Bilirubin: 0.9 mg/dL (ref 0.2–1.2)
Total Protein: 7.2 g/dL (ref 6.1–8.1)

## 2024-05-09 LAB — LIPID PANEL
Cholesterol: 184 mg/dL (ref 0–200)
HDL: 39.1 mg/dL (ref >39.00)
LDL Cholesterol: 115 mg/dL — ABNORMAL HIGH (ref 0–99)
NonHDL: 144.98
Total CHOL/HDL Ratio: 5
Triglycerides: 152 mg/dL — ABNORMAL HIGH (ref 0.0–149.0)
VLDL: 30.4 mg/dL (ref 0.0–40.0)

## 2024-05-09 LAB — HEMOGLOBIN A1C: Hgb A1c MFr Bld: 6.1 % (ref 4.6–6.5)

## 2024-05-09 LAB — TIQ-MISC

## 2024-05-09 LAB — MICROALBUMIN / CREATININE URINE RATIO
Creatinine,U: 134.4 mg/dL
Microalb Creat Ratio: 9.7 mg/g (ref 0.0–30.0)
Microalb, Ur: 1.3 mg/dL (ref 0.0–1.9)

## 2024-05-09 LAB — PSA: PSA: 4.88 ng/mL — ABNORMAL HIGH (ref 0.10–4.00)

## 2024-05-09 MED ORDER — OZEMPIC (0.25 OR 0.5 MG/DOSE) 2 MG/3ML ~~LOC~~ SOPN: 0.2500 mg | PEN_INJECTOR | SUBCUTANEOUS | 0 refills | Status: DC

## 2024-05-09 NOTE — Addendum Note (Signed)
 Addended by: DORINA DALLAS HERO on: 05/09/2024 07:38 PM   Modules accepted: Orders

## 2024-05-09 NOTE — Addendum Note (Signed)
 Addended by: DORINA DALLAS HERO on: 05/09/2024 07:31 PM   Modules accepted: Orders

## 2024-05-09 NOTE — Progress Notes (Signed)
 Subjective:    Patient ID: Nicholas Marks, male    DOB: 07-Nov-1955, 68 y.o.   MRN: [id number]  HPI   Pt in for follow up.   1. Hypertension, unspecified type On 2nd check bp well controlled. Continue losartan . - Comp Met (CMET) - Lipid panel   2. Hyperlipidemia, unspecified hyperlipidemia type -advise low cholesterol diet and exercise. Continue atorvastatin .   3. Diabetes mellitus without complication (HCC) -continue metformin  and low sugar diet. Continue metformin  - Hemoglobin A1c   4. Obesity (BMI 35.0-39.9 without comorbidity)  -discussed diet and exercise. Discussed benefit vs risk ozempic. If you decide you want to try let me know.       Nicholas Marks is a 68 year old male with hypertension, hyperlipidemia, and diabetes who presents for management of his cardiovascular risk factors.  He monitors his blood pressure at home, with recent readings of 127/79 mmHg, although he experiences stress during medical visits. He is currently taking losartan  25 mg daily for hypertension.  He has a history of hyperlipidemia and previously discontinued atorvastatin  in February due to muscle pain. He adheres to a low cholesterol diet and exercises regularly. A coronary artery calcium  score in 2023 showed a left anterior descending artery score of 533 and a total calcium  score of 776.  He was diagnosed with diabetes four years ago when his A1c was 7.1%. He is currently on metformin  and notes that his weight fluctuations affect his A1c levels.  He is interested in weight loss medications and has discussed the potential use of GLP-1 receptor agonists. His brother, a retired Librarian, academic, successfully lost weight using similar methods. He has a BMI over 30 and is motivated to lose weight to improve his health.        Review of Systems  Constitutional:  Negative for chills and fever.  HENT:  Negative for congestion and ear pain.   Respiratory:  Negative for chest tightness, shortness of  breath and wheezing.   Cardiovascular:  Negative for chest pain and palpitations.  Gastrointestinal:  Negative for abdominal pain, blood in stool, diarrhea, nausea and vomiting.  Genitourinary:  Positive for frequency. Negative for flank pain, hematuria and testicular pain.  Musculoskeletal:  Negative for back pain and neck pain.  Skin:  Negative for rash.  Neurological:  Negative for dizziness, tremors, seizures and headaches.  Psychiatric/Behavioral:  Negative for behavioral problems, decreased concentration and hallucinations.     Past Medical History:  Diagnosis Date   Diverticulosis 06/09/2022   Kidney stone    Routine health maintenance 12/04/2011     Social History   Socioeconomic History   Marital status: Widowed    Spouse name: Not on file   Number of children: Not on file   Years of education: Not on file   Highest education level: Bachelor's degree (e.g., BA, AB, BS)  Occupational History   Not on file  Tobacco Use   Smoking status: Former    Current packs/day: 0.00    Average packs/day: 1.5 packs/day for 25.0 years (37.5 ttl pk-yrs)    Types: Cigarettes    Start date: 06/22/1979    Quit date: 06/21/2004    Years since quitting: 19.8   Smokeless tobacco: Never  Vaping Use   Vaping status: Former  Substance and Sexual Activity   Alcohol use: Yes    Alcohol/week: 2.0 standard drinks of alcohol    Types: 2 Shots of liquor per week    Comment: socially might go months  Drug use: Never   Sexual activity: Not on file  Other Topics Concern   Not on file  Social History Narrative   Not on file   Social Drivers of Health   Financial Resource Strain: Patient Declined (05/09/2024)   Overall Financial Resource Strain (CARDIA)    Difficulty of Paying Living Expenses: Patient declined  Food Insecurity: Patient Declined (05/09/2024)   Hunger Vital Sign    Worried About Running Out of Food in the Last Year: Patient declined    Ran Out of Food in the Last Year:  Patient declined  Transportation Needs: Patient Declined (05/09/2024)   PRAPARE - Administrator, Civil Service (Medical): Patient declined    Lack of Transportation (Non-Medical): Patient declined  Physical Activity: Unknown (06/03/2023)   Exercise Vital Sign    Days of Exercise per Week: 4 days    Minutes of Exercise per Session: Not on file  Recent Concern: Physical Activity - Insufficiently Active (06/03/2023)   Exercise Vital Sign    Days of Exercise per Week: 4 days    Minutes of Exercise per Session: 30 min  Stress: No Stress Concern Present (06/03/2023)   [employer] of Occupational Health - Occupational Stress Questionnaire    Feeling of Stress : Not at all  Social Connections: Unknown (05/09/2024)   Social Connection and Isolation Panel    Frequency of Communication with Friends and Family: Patient declined    Frequency of Social Gatherings with Friends and Family: Not on file    Attends Religious Services: Patient declined    Active Member of Clubs or Organizations: Patient declined    Attends Banker Meetings: Not on file    Marital Status: Patient declined  Intimate Partner Violence: Not At Risk (06/25/2022)   Humiliation, Afraid, Rape, and Kick questionnaire    Fear of Current or Ex-Partner: No    Emotionally Abused: No    Physically Abused: No    Sexually Abused: No    Past Surgical History:  Procedure Laterality Date   APPENDECTOMY      Family History  Problem Relation Age of Onset   Hypertension Neg Hx    Diabetes Neg Hx    Heart disease Neg Hx    Cancer Neg Hx     No Known Allergies  Current Outpatient Medications on File Prior to Visit  Medication Sig Dispense Refill   losartan  (COZAAR ) 25 MG tablet TAKE 1 TABLET(25 MG) BY MOUTH DAILY 90 tablet 1   metFORMIN  (GLUCOPHAGE ) 500 MG tablet TAKE 1 TABLET(500 MG) BY MOUTH TWICE DAILY 180 tablet 1   atorvastatin  (LIPITOR) 10 MG tablet Take 1 tablet (10 mg total) by mouth  daily. (Patient not taking: Reported on 05/09/2024) 90 tablet 3   blood glucose meter kit and supplies KIT Dispense based on patient and insurance preference. Use up to four times daily as directed. (FOR ICD-9 250.00, 250.01). (Patient not taking: Reported on 05/09/2024) 1 each 0   Blood Glucose Monitoring Suppl (ONETOUCH VERIO) w/Device KIT Use as directed to check sugar 3 times a day.  Dx Code: E11.9 (Patient not taking: Reported on 05/09/2024) 1 kit 0   glucose blood (ONETOUCH VERIO) test strip Use as instructed (Patient not taking: Reported on 05/09/2024) 300 each 1   levocetirizine (XYZAL ) 5 MG tablet TAKE 1 TABLET(5 MG) BY MOUTH EVERY EVENING (Patient not taking: Reported on 05/09/2024) 90 tablet 0   OneTouch Delica Lancets 33G MISC Use as directed 3 times a day.  Dx Code E11.9 (Patient not taking: Reported on 05/09/2024) 300 each 1   No current facility-administered medications on file prior to visit.    BP 127/79   Pulse 77   Temp 98 F (36.7 C) (Oral)   Resp 15   Ht 5' 8 (1.727 m)   Wt 252 lb 3.2 oz (114.4 kg)   SpO2 98%   BMI 38.35 kg/m        Objective:   Physical Exam  General Mental Status- Alert. General Appearance- Not in acute distress.   Skin General: Color- Normal Color. Moisture- Normal Moisture.  Neck Carotid Arteries- Normal color. Moisture- Normal Moisture. No carotid bruits. No JVD.  Chest and Lung Exam Auscultation: Breath Sounds:-Normal.  Cardiovascular Auscultation:Rythm- Regular. Murmurs & Other Heart Sounds:Auscultation of the heart reveals- No Murmurs.  Abdomen Inspection:-Inspeection Normal. Palpation/Percussion:Note:No mass. Palpation and Percussion of the abdomen reveal- Non Tender, Non Distended + BS, no rebound or guarding.  Neurologic Cranial Nerve exam:- CN III-XII intact(No nystagmus), symmetric smile.Strength:- 5/5 equal and symmetric strength both upper and lower extremities.         Assessment & Plan:   Patient  Instructions  Type 2 diabetes mellitus Discussed potential use of Ozempic for weight loss and diabetes management. Explained risks including thyroid cancer and pancreatitis. - Order A1c test. - Consider prescribing low dose Ozempic based on A1c results. - Evaluate need to continue metformin  based on A1c results.  Obesity Discussed weight loss strategies including GLP-1 agonists. Emphasized diet modification and exercise to maintain muscle mass. - Consider prescribing Ozempic for weight loss. - Advise on diet modification and exercise, including weight lifting to maintain muscle mass.  Hypertension Blood pressure slightly elevated at 127/79 mmHg. Managed with losartan  25 mg. - Continue losartan  25 mg daily.  Coronary artery calcification Coronary artery calcium  score increased. - Order fasting lipid panel. - Consider referral to cardiologist for further evaluation based on lipid panel results and coronary artery calcium  score.  Hyperlipidemia Discontinued atorvastatin  due to muscle pain. Elevated risk indicated by coronary artery calcium  score. - Order fasting lipid panel. - Consider referral to cardiologist based on lipid panel results and coronary artery calcium  score.  Follow up date to be determined after lab review. 3 month or sooner.   Hokulani Rogel, PA-C   I personally spent a total of 40 minutes in the care of the patient today including performing a medically appropriate exam/evaluation, counseling and educating, placing orders, and documenting clinical information in the EHR.

## 2024-05-09 NOTE — Patient Instructions (Signed)
 Type 2 diabetes mellitus Discussed potential use of Ozempic for weight loss and diabetes management. Explained risks including thyroid cancer and pancreatitis. - Order A1c test. - Consider prescribing low dose Ozempic based on A1c results. - Evaluate need to continue metformin  based on A1c results.  Obesity Discussed weight loss strategies including GLP-1 agonists. Emphasized diet modification and exercise to maintain muscle mass. - Consider prescribing Ozempic for weight loss. - Advise on diet modification and exercise, including weight lifting to maintain muscle mass.  Hypertension Blood pressure slightly elevated at 127/79 mmHg. Managed with losartan  25 mg. - Continue losartan  25 mg daily.  Coronary artery calcification Coronary artery calcium  score increased. - Order fasting lipid panel. - Consider referral to cardiologist for further evaluation based on lipid panel results and coronary artery calcium  score.  Hyperlipidemia Discontinued atorvastatin  due to muscle pain. Elevated risk indicated by coronary artery calcium  score. - Order fasting lipid panel. - Consider referral to cardiologist based on lipid panel results and coronary artery calcium  score.  Follow up date to be determined after lab review. 3 month or sooner.

## 2024-05-10 ENCOUNTER — Other Ambulatory Visit (HOSPITAL_COMMUNITY): Payer: Self-pay

## 2024-05-10 ENCOUNTER — Telehealth: Payer: Self-pay

## 2024-05-10 NOTE — Telephone Encounter (Signed)
 Pharmacy Patient Advocate Encounter   Received notification from CoverMyMeds that prior authorization for Ozempic (0.25 or 0.5 MG/DOSE) 2MG /3ML pen-injectors  is required/requested.   Insurance verification completed.   The patient is insured through [employer].   Per test claim: PA required; PA submitted to above mentioned insurance via Latent Key/confirmation #/EOC [id number] Status is pending

## 2024-05-10 NOTE — Progress Notes (Signed)
 Last read by Lamar LITTIE Law at 5:48AM on 05/10/2024.

## 2024-05-11 ENCOUNTER — Other Ambulatory Visit: Payer: Self-pay

## 2024-05-11 MED ORDER — LOSARTAN POTASSIUM 25 MG PO TABS: 25.0000 mg | ORAL_TABLET | Freq: Every day | ORAL | 1 refills | Status: AC

## 2024-05-11 NOTE — Telephone Encounter (Signed)
 Spoke to pt and notified him of the approval also pt needed a refill on his losartan  so I sent that in for him

## 2024-05-11 NOTE — Telephone Encounter (Signed)
 Pharmacy Patient Advocate Encounter  Received notification from [facility] MEDd that Prior Authorization for Ozempic (0.25 or 0.5 MG/DOSE) 2MG /3ML pen-injectors  has been APPROVED from 05/11/2024 to 05/11/2025   PA #/Case ID/Reference #: [id number]

## 2024-05-22 NOTE — Progress Notes (Unsigned)
 Chief Complaint: Abnormal PSA  History of Present Illness:  Nicholas Marks is a 68 y.o. male who is seen in consultation from [place], [place], PA-C for evaluation of an abnormal PSA level. Prior PSA data:  10.20.2025--4.88 3.18.2022--3.50 2.7.2022--3.84  He has had no prior urologic issues with passing of stone in 2017.  No significant LUTS.  IPSS 2/0  Past Medical History:  Past Medical History:  Diagnosis Date   Diverticulosis 06/09/2022   Kidney stone    Routine health maintenance 12/04/2011    Past Surgical History:  Past Surgical History:  Procedure Laterality Date   APPENDECTOMY      Allergies:  No Known Allergies  Family History:  Family History  Problem Relation Age of Onset   Hypertension Neg Hx    Diabetes Neg Hx    Heart disease Neg Hx    Cancer Neg Hx     Social History:  Social History   Tobacco Use   Smoking status: Former    Current packs/day: 0.00    Average packs/day: 1.5 packs/day for 25.0 years (37.5 ttl pk-yrs)    Types: Cigarettes    Start date: 06/22/1979    Quit date: 06/21/2004    Years since quitting: 19.9   Smokeless tobacco: Never  Vaping Use   Vaping status: Former  Substance Use Topics   Alcohol use: Yes    Alcohol/week: 2.0 standard drinks of alcohol    Types: 2 Shots of liquor per week    Comment: socially might go months   Drug use: Never    Review of symptoms:  Constitutional:  Negative for unexplained weight loss, night sweats, fever, chills ENT:  Negative for nose bleeds, sinus pain, painful swallowing CV:  Negative for chest pain, shortness of breath, exercise intolerance, palpitations, loss of consciousness Resp:  Negative for cough, wheezing, shortness of breath GI:  Negative for nausea, vomiting, diarrhea, bloody stools GU:  Positives noted in HPI; otherwise negative for gross hematuria, dysuria, urinary incontinence Neuro:  Negative for seizures, poor balance, limb weakness, slurred speech Psych:  Negative  for lack of energy, depression, anxiety Endocrine:  Negative for polydipsia, polyuria, symptoms of hypoglycemia (dizziness, hunger, sweating) Hematologic:  Negative for anemia, purpura, petechia, prolonged or excessive bleeding, use of anticoagulants  Allergic:  Negative for difficulty breathing or choking as a result of exposure to anything; no shellfish allergy; no allergic response (rash/itch) to materials, foods  Physical exam: There were no vitals taken for this visit. GENERAL APPEARANCE:  Well appearing, well developed, well nourished, NAD HEENT: Atraumatic, Normocephalic. NECK: Normal appearance LUNGS: Normal inspiratory and expiratory excursion HEART: Regular Rate ABDOMEN: Obese.  No inguinal hernias. GU: Phallus normal, no lesions. Scrotal skin normal. Testicles/epididymal structures normal. Meatus normal. Normal anal sphincter tone, prostate 60 mL, symmetric, non nodular, non tender. EXTREMITIES: Moves all extremities well.  Without clubbing, cyanosis, or edema. NEUROLOGIC:  Alert and oriented x 3, normal gait, CN II-XII grossly intact.  MENTAL STATUS:  Appropriate. SKIN:  Warm, dry and intact.    Results:  I have reviewed urinalysis--clear  IPSS history reviewed  I have reviewed PSA results  I have reviewed prior imaging--CT scan from 2017.  Estimated prostate volume at that time of 45 mL   Assessment: Elevated PSA.  This has actually not changed much over the past 3 years.  DRE consistent with benign gland.   Plan: -Causative factors of PSA elevation discussed with patient  - Reassured about his exam  - We will  send iso-PSA today-follow-up dependent on results of that

## 2024-05-23 ENCOUNTER — Ambulatory Visit (INDEPENDENT_AMBULATORY_CARE_PROVIDER_SITE_OTHER): Admitting: Urology

## 2024-05-23 VITALS — BP 161/89 | HR 80 | Ht 68.0 in | Wt 250.0 lb

## 2024-05-23 DIAGNOSIS — R972 Elevated prostate specific antigen [PSA]: Secondary | ICD-10-CM

## 2024-05-23 DIAGNOSIS — N400 Enlarged prostate without lower urinary tract symptoms: Secondary | ICD-10-CM

## 2024-05-23 LAB — URINALYSIS, ROUTINE W REFLEX MICROSCOPIC
Bilirubin, UA: NEGATIVE
Glucose, UA: NEGATIVE
Ketones, UA: NEGATIVE
Leukocytes,UA: NEGATIVE
Nitrite, UA: NEGATIVE
Protein,UA: NEGATIVE
RBC, UA: NEGATIVE
Specific Gravity, UA: <=1.005 — AB (ref 1.005–1.030)
Urobilinogen, Ur: 0.2 mg/dL (ref 0.2–1.0)
pH, UA: 6 (ref 5.0–7.5)

## 2024-05-26 ENCOUNTER — Encounter: Payer: Self-pay | Admitting: Urology

## 2024-06-08 ENCOUNTER — Other Ambulatory Visit: Payer: Self-pay | Admitting: Urology

## 2024-06-08 ENCOUNTER — Encounter: Payer: Self-pay | Admitting: Urology

## 2024-06-08 DIAGNOSIS — R972 Elevated prostate specific antigen [PSA]: Secondary | ICD-10-CM

## 2024-06-20 ENCOUNTER — Encounter: Payer: Self-pay | Admitting: Cardiology

## 2024-06-20 NOTE — Progress Notes (Signed)
 HPI: Follow-up hypertension and hyperlipidemia.  Previously followed by Dr. Edwyna but transitioning to me.  Calcium  score November 2023 776 which was 87th percentile.  Echocardiogram December 2023 showed normal LV function.  Stress echocardiogram December 2023 normal.  Since last seen patient denies dyspnea, chest pain or syncope.  No palpitations.  Current Outpatient Medications  Medication Sig Dispense Refill   losartan  (COZAAR ) 25 MG tablet Take 1 tablet (25 mg total) by mouth daily. 90 tablet 1   Semaglutide ,0.25 or 0.5MG /DOS, (OZEMPIC , 0.25 OR 0.5 MG/DOSE,) 2 MG/3ML SOPN 0.5 mg weekly injection 3 mL 0   levocetirizine (XYZAL ) 5 MG tablet TAKE 1 TABLET(5 MG) BY MOUTH EVERY EVENING (Patient not taking: Reported on 06/29/2024) 90 tablet 0   No current facility-administered medications for this visit.     Past Medical History:  Diagnosis Date   Coronary artery calcification    Diverticulosis 06/09/2022   Hyperlipidemia    Hypertension    Kidney stone    Routine health maintenance 12/04/2011    Past Surgical History:  Procedure Laterality Date   APPENDECTOMY      Social History   Socioeconomic History   Marital status: Widowed    Spouse name: Not on file   Number of children: Not on file   Years of education: Not on file   Highest education level: Bachelor's degree (e.g., BA, AB, BS)  Occupational History   Not on file  Tobacco Use   Smoking status: Former    Current packs/day: 0.00    Average packs/day: 1.5 packs/day for 25.0 years (37.5 ttl pk-yrs)    Types: Cigarettes    Start date: 06/22/1979    Quit date: 06/21/2004    Years since quitting: 20.0   Smokeless tobacco: Never  Vaping Use   Vaping status: Former  Substance and Sexual Activity   Alcohol use: Not Currently    Comment: may have 2 oz then socially might go months,   Drug use: Never   Sexual activity: Not on file  Other Topics Concern   Not on file  Social History Narrative   Not on file    Social Drivers of Health   Financial Resource Strain: Patient Declined (05/09/2024)   Overall Financial Resource Strain (CARDIA)    Difficulty of Paying Living Expenses: Patient declined  Food Insecurity: Patient Declined (06/23/2024)   Hunger Vital Sign    Worried About Running Out of Food in the Last Year: Patient declined    Ran Out of Food in the Last Year: Patient declined  Transportation Needs: Patient Declined (06/23/2024)   PRAPARE - Administrator, Civil Service (Medical): Patient declined    Lack of Transportation (Non-Medical): Patient declined  Physical Activity: Sufficiently Active (06/23/2024)   Exercise Vital Sign    Days of Exercise per Week: 5 days    Minutes of Exercise per Session: 60 min  Stress: No Stress Concern Present (06/23/2024)   [employer] of Occupational Health - Occupational Stress Questionnaire    Feeling of Stress: Only a little  Social Connections: Socially Isolated (06/23/2024)   Social Connection and Isolation Panel    Frequency of Communication with Friends and Family: Once a week    Frequency of Social Gatherings with Friends and Family: Once a week    Attends Religious Services: More than 4 times per year    Active Member of [employer] or Organizations: No    Attends Banker Meetings: Never    Marital Status:  Widowed  Intimate Partner Violence: Not At Risk (06/23/2024)   Humiliation, Afraid, Rape, and Kick questionnaire    Fear of Current or Ex-Partner: No    Emotionally Abused: No    Physically Abused: No    Sexually Abused: No    Family History  Problem Relation Age of Onset   Hypertension Neg Hx    Diabetes Neg Hx    Heart disease Neg Hx    Cancer Neg Hx     ROS: no fevers or chills, productive cough, hemoptysis, dysphasia, odynophagia, melena, hematochezia, dysuria, hematuria, rash, seizure activity, orthopnea, PND, pedal edema, claudication. Remaining systems are negative.  Physical Exam: Well-developed  well-nourished in no acute distress.  Skin is warm and dry.  HEENT is normal.  Neck is supple.  Chest is clear to auscultation with normal expansion.  Cardiovascular exam is regular rate and rhythm.  Abdominal exam nontender or distended. No masses palpated.  Positive bruit Extremities show no edema. neuro grossly intact  EKG Interpretation Date/Time:  Wednesday June 29 2024 07:59:58 EST Ventricular Rate:  82 PR Interval:  162 QRS Duration:  90 QT Interval:  358 QTC Calculation: 418 R Axis:   23  Text Interpretation: Normal sinus rhythm Normal ECG Confirmed by Pietro Rogue ([id number]) on 06/29/2024 8:00:35 AM    A/P  1 coronary calcification-patient denies chest pain and follow-up functional study showed no ischemia.  Plan medical therapy.  Continue aspirin 81 mg daily.  Resume statin.  2 hyperlipidemia-given coronary calcification and history of diabetes mellitus I would like for goal LDL to be less than 55.  Last LDL 10/25 115.  He had some myalgias with Lipitor.  I will try Crestor 20 mg daily.  Check lipids and liver in 8 weeks.  If he does not tolerate will try Zetia or PCSK9 inhibitor.  3 hypertension-patient's blood pressure is controlled.  Continue present medical regimen.  4 abdominal bruit-schedule ultrasound to rule out aneurysm (he has a remote history of tobacco use).  Rogue Pietro, MD

## 2024-06-23 ENCOUNTER — Telehealth: Payer: Self-pay | Admitting: *Deleted

## 2024-06-23 ENCOUNTER — Ambulatory Visit

## 2024-06-23 VITALS — Ht 68.0 in | Wt 250.0 lb

## 2024-06-23 DIAGNOSIS — Z0000 Encounter for general adult medical examination without abnormal findings: Secondary | ICD-10-CM | POA: Diagnosis not present

## 2024-06-23 MED ORDER — OZEMPIC (0.25 OR 0.5 MG/DOSE) 2 MG/3ML ~~LOC~~ SOPN: PEN_INJECTOR | SUBCUTANEOUS | 0 refills | Status: DC

## 2024-06-23 NOTE — Patient Instructions (Addendum)
 Nicholas Marks,  Thank you for taking the time for your Medicare Wellness Visit. I appreciate your continued commitment to your health goals. Please review the care plan we discussed, and feel free to reach out if I can assist you further.  Please note that Annual Wellness Visits do not include a physical exam. Some assessments may be limited, especially if the visit was conducted virtually. If needed, we may recommend an in-person follow-up with your provider.  Goal: Lose weight   Ongoing Care Seeing your primary care provider every 3 to 6 months helps us  monitor your health and provide consistent, personalized care.   Dallas Maxwell, PA-C: 11/21/24 8am Medicare AWV:  12/8 26 9am, telephone  Referrals If a referral was made during today's visit and you haven't received any updates within two weeks, please contact the referred provider directly to check on the status.  Cologuard:  Please complete and mail back the kit you currently have if it is still in date.  If it is not in date, let us  know and we will order another one for you.  Recommended Screenings:  You will need to get the following vaccines at your local pharmacy: Tetanus  Health Maintenance  Topic Date Due   Eye exam for diabetics  Never done   DTaP/Tdap/Td vaccine (1 - Tdap) Never done   Medicare Annual Wellness Visit  06/26/2023   Cologuard (Stool DNA test)  09/20/2023   Complete foot exam   12/05/2023   Hepatitis C Screening  01/02/2025*   Hemoglobin A1C  11/07/2024   Yearly kidney function blood test for diabetes  05/09/2025   Yearly kidney health urinalysis for diabetes  05/09/2025   Pneumococcal Vaccine for age over 32  Completed   Flu Shot  Completed   Zoster (Shingles) Vaccine  Completed   Meningitis B Vaccine  Aged Out   Screening for Lung Cancer  Discontinued   COVID-19 Vaccine  Discontinued  *Topic was postponed. The date shown is not the original due date.       06/23/2024    1:05 PM  Advanced Directives   Does Patient Have a Medical Advance Directive? No  Would patient like information on creating a medical advance directive? Yes (MAU/Ambulatory/Procedural Areas - Information given)  Please let me know if you do not receive your Advanced Directive Packet within 1 week.  Once completed and notarized, you may return a copy of your Advanced Directive(s) by either of the following: Bring a copy of your health care power of attorney and living will to the office to be added to your chart at your convenience. You can mail a copy to [facility] [address] [place], [place] [address] or email to [email or web address]   Vision: Annual vision screenings are recommended for early detection of glaucoma, cataracts, and diabetic retinopathy. These exams can also reveal signs of chronic conditions such as diabetes and high blood pressure.  Dental: Annual dental screenings help detect early signs of oral cancer, gum disease, and other conditions linked to overall health, including heart disease and diabetes.  Please see the attached documents for additional preventive care recommendations.

## 2024-06-23 NOTE — Telephone Encounter (Signed)
 Pt had AWV today. States he has 2 weeks left on the Ozempic  0.25mg  dose.  Wants to know when it will be time to increase dose to 0.5mg ?  If it is time, please send new RX to [facility] on [address] and call pt to let him know once completed. Thanks!

## 2024-06-23 NOTE — Addendum Note (Signed)
 Addended by: DORINA DALLAS HERO on: 06/23/2024 04:43 PM   Modules accepted: Orders

## 2024-06-23 NOTE — Progress Notes (Signed)
 Please attest this visit in the absence of patient primary care provider.    Chief Complaint  Patient presents with   Medicare Wellness     Subjective:   Nicholas Marks is a 68 y.o. male who presents for a Medicare Annual Wellness Visit.  Visit info / Clinical Intake: Medicare Wellness Visit Type:: Subsequent Annual Wellness Visit Persons participating in visit and providing information:: patient Medicare Wellness Visit Mode:: Telephone If telephone:: video declined Since this visit was completed virtually, some vitals may be partially provided or unavailable. Missing vitals are due to the limitations of the virtual format.: Unable to obtain vitals - no equipment If Telephone or Video please confirm:: I connected with patient using audio/video enable telemedicine. I verified patient identity with two identifiers, discussed telehealth limitations, and patient agreed to proceed. Patient Location:: home Provider Location:: office Interpreter Needed?: No Pre-visit prep was completed: yes AWV questionnaire completed by patient prior to visit?: no Living arrangements:: (!) lives alone Patient's Overall Health Status Rating: good Typical amount of pain: none Does pain affect daily life?: no Are you currently prescribed opioids?: no  Dietary Habits and Nutritional Risks How many meals a day?: 2 (a snack at night) Eats fruit and vegetables daily?: yes (takes a one a day vit) Most meals are obtained by: preparing own meals In the last 2 weeks, have you had any of the following?: none Diabetic:: (!) yes Any non-healing wounds?: no How often do you check your BS?: as needed Would you like to be referred to a Nutritionist or for Diabetic Management? : no  Functional Status Activities of Daily Living (to include ambulation/medication): Independent Ambulation: Independent Medication Administration: Independent Home Management (perform basic housework or laundry): Independent Manage  your own finances?: yes Primary transportation is: driving Concerns about vision?: no *vision screening is required for WTM* (up to date with [facility]) Concerns about hearing?: no  Fall Screening Falls in the past year?: 0 Number of falls in past year: 0 Was there an injury with Fall?: 0 Fall Risk Category Calculator: 0 Patient Fall Risk Level: Low Fall Risk  Fall Risk Patient at Risk for Falls Due to: No Fall Risks Fall risk Follow up: Falls evaluation completed  Home and Transportation Safety: All rugs have non-skid backing?: N/A, no rugs All stairs or steps have railings?: yes Grab bars in the bathtub or shower?: (!) no Have non-skid surface in bathtub or shower?: (!) no Good home lighting?: yes Regular seat belt use?: yes Hospital stays in the last year:: no  Cognitive Assessment Difficulty concentrating, remembering, or making decisions? : no Will 6CIT or Mini Cog be Completed: yes What year is it?: 0 points What month is it?: 0 points Give patient an address phrase to remember (5 components): [address], [place]  About what time is it?: 0 points Count backwards from 20 to 1: 0 points Say the months of the year in reverse: 0 points Repeat the address phrase from earlier: 2 points 6 CIT Score: 2 points  Advance Directives (For Healthcare) Does Patient Have a Medical Advance Directive?: No Would patient like information on creating a medical advance directive?: Yes (MAU/Ambulatory/Procedural Areas - Information given)  Reviewed/Updated  Reviewed/Updated: Reviewed All (Medical, Surgical, Family, Medications, Allergies, Care Teams, Patient Goals)    Allergies (verified) Patient has no known allergies.   Current Medications (verified) Outpatient Encounter Medications as of 06/23/2024  Medication Sig   losartan  (COZAAR ) 25 MG tablet Take 1 tablet (25 mg total) by  mouth daily.   Semaglutide ,0.25 or 0.5MG /DOS, (OZEMPIC , 0.25 OR 0.5 MG/DOSE,) 2 MG/3ML  SOPN Inject 0.25 mg into the skin once a week.   atorvastatin  (LIPITOR) 10 MG tablet Take 1 tablet (10 mg total) by mouth daily. (Patient not taking: Reported on 05/23/2024)   blood glucose meter kit and supplies KIT Dispense based on patient and insurance preference. Use up to four times daily as directed. (FOR ICD-9 250.00, 250.01). (Patient not taking: Reported on 05/23/2024)   Blood Glucose Monitoring Suppl (ONETOUCH VERIO) w/Device KIT Use as directed to check sugar 3 times a day.  Dx Code: E11.9 (Patient not taking: Reported on 05/23/2024)   glucose blood (ONETOUCH VERIO) test strip Use as instructed (Patient not taking: Reported on 05/23/2024)   levocetirizine (XYZAL ) 5 MG tablet TAKE 1 TABLET(5 MG) BY MOUTH EVERY EVENING (Patient not taking: Reported on 05/23/2024)   metFORMIN  (GLUCOPHAGE ) 500 MG tablet TAKE 1 TABLET(500 MG) BY MOUTH TWICE DAILY (Patient not taking: Reported on 05/23/2024)   OneTouch Delica Lancets 33G MISC Use as directed 3 times a day.  Dx Code E11.9 (Patient not taking: Reported on 05/23/2024)   No facility-administered encounter medications on file as of 06/23/2024.    History: Past Medical History:  Diagnosis Date   Coronary artery calcification    Diverticulosis 06/09/2022   Hyperlipidemia    Hypertension    Kidney stone    Routine health maintenance 12/04/2011   Past Surgical History:  Procedure Laterality Date   APPENDECTOMY     Family History  Problem Relation Age of Onset   Hypertension Neg Hx    Diabetes Neg Hx    Heart disease Neg Hx    Cancer Neg Hx    Social History   Occupational History   Not on file  Tobacco Use   Smoking status: Former    Current packs/day: 0.00    Average packs/day: 1.5 packs/day for 25.0 years (37.5 ttl pk-yrs)    Types: Cigarettes    Start date: 06/22/1979    Quit date: 06/21/2004    Years since quitting: 20.0   Smokeless tobacco: Never  Vaping Use   Vaping status: Former  Substance and Sexual Activity   Alcohol  use: Not Currently    Comment: may have 2 oz then socially might go months,   Drug use: Never   Sexual activity: Not on file   Tobacco Counseling Counseling given: Not Answered  SDOH Screenings   Food Insecurity: Patient Declined (06/23/2024)  Housing: Unknown (06/23/2024)  Transportation Needs: Patient Declined (06/23/2024)  Utilities: Not At Risk (06/23/2024)  Alcohol Screen: Low Risk  (05/09/2024)  Depression (PHQ2-9): Low Risk  (06/23/2024)  Financial Resource Strain: Patient Declined (05/09/2024)  Physical Activity: Sufficiently Active (06/23/2024)  Social Connections: Socially Isolated (06/23/2024)  Stress: No Stress Concern Present (06/23/2024)  Tobacco Use: Medium Risk (06/23/2024)   See flowsheets for full screening details  Depression Screen PHQ 2 & 9 Depression Scale- Over the past 2 weeks, how often have you been bothered by any of the following problems? Little interest or pleasure in doing things: 0 Feeling down, depressed, or hopeless (PHQ Adolescent also includes...irritable): 0 PHQ-2 Total Score: 0 Trouble falling or staying asleep, or sleeping too much: 0 Feeling tired or having little energy: 0 Poor appetite or overeating (PHQ Adolescent also includes...weight loss): 0 Feeling bad about yourself - or that you are a failure or have let yourself or your family down: 0 Trouble concentrating on things, such as reading the newspaper or  watching television (PHQ Adolescent also includes...like school work): 0 Moving or speaking so slowly that other people could have noticed. Or the opposite - being so fidgety or restless that you have been moving around a lot more than usual: 0 Thoughts that you would be better off dead, or of hurting yourself in some way: 0 PHQ-9 Total Score: 0 If you checked off any problems, how difficult have these problems made it for you to do your work, take care of things at home, or get along with other people?: Not difficult at all     Goals  Addressed             This Visit's Progress    Patient Stated   On track    Lose weight              Objective:    Today's Vitals   06/23/24 1300  Weight: 250 lb (113.4 kg)  Height: 5' 8 (1.727 m)   Body mass index is 38.01 kg/m.  Hearing/Vision screen No results found. Immunizations and Health Maintenance Health Maintenance  Topic Date Due   OPHTHALMOLOGY EXAM  Never done   DTaP/Tdap/Td (1 - Tdap) Never done   Fecal DNA (Cologuard)  09/20/2023   FOOT EXAM  12/05/2023   Hepatitis C Screening  01/02/2025 (Originally 06/14/1974)   HEMOGLOBIN A1C  11/07/2024   Diabetic kidney evaluation - eGFR measurement  05/09/2025   Diabetic kidney evaluation - Urine ACR  05/09/2025   Medicare Annual Wellness (AWV)  06/23/2025   Pneumococcal Vaccine: 50+ Years  Completed   Influenza Vaccine  Completed   Zoster Vaccines- Shingrix   Completed   Meningococcal B Vaccine  Aged Out   Lung Cancer Screening  Discontinued   COVID-19 Vaccine  Discontinued        Assessment/Plan:  This is a routine wellness examination for Nicholas Marks.  Patient Care Team: Saguier, Edward, PA-C as PCP - General (Internal Medicine) Roseann, Adine PARAS., MD as Referring Physician (Urology) Maurie Dose [facility])  I have personally reviewed and noted the following in the patient's chart:   Medical and social history Use of alcohol, tobacco or illicit drugs  Current medications and supplements including opioid prescriptions. Functional ability and status Nutritional status Physical activity Advanced directives List of other physicians Hospitalizations, surgeries, and ER visits in previous 12 months Vitals Screenings to include cognitive, depression, and falls Referrals and appointments  No orders of the defined types were placed in this encounter.  In addition, I have reviewed and discussed with patient certain preventive protocols, quality metrics, and best practice recommendations. A  written personalized care plan for preventive services as well as general preventive health recommendations were provided to patient.   Lolita Libra, CMA   06/23/2024   Return in 1 year (on 06/23/2025).  After Visit Summary: (MyChart) Due to this being a telephonic visit, the after visit summary with patients personalized plan was offered to patient via MyChart   Nurse Notes: see phone note

## 2024-06-29 ENCOUNTER — Encounter: Payer: Self-pay | Admitting: Cardiology

## 2024-06-29 ENCOUNTER — Other Ambulatory Visit: Payer: Self-pay | Admitting: *Deleted

## 2024-06-29 ENCOUNTER — Ambulatory Visit: Attending: Cardiology | Admitting: Cardiology

## 2024-06-29 VITALS — BP 127/79 | HR 82 | Ht 68.0 in | Wt 257.0 lb

## 2024-06-29 DIAGNOSIS — Z136 Encounter for screening for cardiovascular disorders: Secondary | ICD-10-CM

## 2024-06-29 DIAGNOSIS — E785 Hyperlipidemia, unspecified: Secondary | ICD-10-CM | POA: Diagnosis present

## 2024-06-29 DIAGNOSIS — I2510 Atherosclerotic heart disease of native coronary artery without angina pectoris: Secondary | ICD-10-CM | POA: Diagnosis present

## 2024-06-29 DIAGNOSIS — I10 Essential (primary) hypertension: Secondary | ICD-10-CM | POA: Insufficient documentation

## 2024-06-29 MED ORDER — ROSUVASTATIN CALCIUM 20 MG PO TABS: 20.0000 mg | ORAL_TABLET | Freq: Every day | ORAL | 3 refills | Status: AC

## 2024-06-29 NOTE — Patient Instructions (Signed)
 Medication Instructions:   START ROSUVASTATIN 20 MG ONCE DAILY  *If you need a refill on your cardiac medications before your next appointment, please call your pharmacy*  Lab Work:   Your physician recommends that you return for lab work in: [facility]  If you have labs (blood work) drawn today and your tests are completely normal, you will receive your results only by: MyChart Message (if you have MyChart) OR A paper copy in the mail If you have any lab test that is abnormal or we need to change your treatment, we will call you to review the results.  Testing/Procedures:  Your physician has requested that you have an abdominal aorta duplex. During this test, an ultrasound is used to evaluate the aorta. Allow 30 minutes for this exam. Do not eat after midnight the day before and avoid carbonated beverages.  Please note: We ask at that you not bring children with you during ultrasound (echo/ vascular) testing. Due to room size and safety concerns, children are not allowed in the ultrasound rooms during exams. Our front office staff cannot provide observation of children in our lobby area while testing is being conducted. An adult accompanying a patient to their appointment will only be allowed in the ultrasound room at the discretion of the ultrasound technician under special circumstances. We apologize for any inconvenience. [facility] 1 ST FLOOR IMAGING DEPARTMENT  Follow-Up: At [facility], you and your health needs are our priority.  As part of our continuing mission to provide you with exceptional heart care, our providers are all part of one team.  This team includes your primary Cardiologist (physician) and Advanced Practice Providers or APPs (Physician Assistants and Nurse Practitioners) who all work together to provide you with the care you need, when you need it.  Your next appointment:   12 month(s)  Provider:   REDELL SHALLOW MD

## 2024-07-04 ENCOUNTER — Ambulatory Visit: Payer: Self-pay | Admitting: Cardiology

## 2024-07-04 ENCOUNTER — Ambulatory Visit (HOSPITAL_BASED_OUTPATIENT_CLINIC_OR_DEPARTMENT_OTHER): Admission: RE | Admit: 2024-07-04 | Discharge: 2024-07-04 | Attending: Cardiology | Admitting: Cardiology

## 2024-07-04 DIAGNOSIS — Z136 Encounter for screening for cardiovascular disorders: Secondary | ICD-10-CM

## 2024-07-24 ENCOUNTER — Other Ambulatory Visit: Payer: Self-pay | Admitting: Medical

## 2024-07-27 NOTE — Telephone Encounter (Signed)
Rx refill sent to pt pharmacy 

## 2024-11-21 ENCOUNTER — Ambulatory Visit: Admitting: Medical

## 2024-11-28 ENCOUNTER — Other Ambulatory Visit

## 2024-12-06 ENCOUNTER — Ambulatory Visit: Admitting: Urology

## 2025-06-27 ENCOUNTER — Ambulatory Visit
